# Patient Record
Sex: Female | Born: 1980 | Hispanic: No | Marital: Married | Smoking: Never smoker
Health system: Southern US, Community
[De-identification: ages and names within clinical notes are randomized; demographics above are authoritative.]

## PROBLEM LIST (undated history)

## (undated) ENCOUNTER — Inpatient Hospital Stay (HOSPITAL_COMMUNITY): Payer: Self-pay

## (undated) DIAGNOSIS — T4145XA Adverse effect of unspecified anesthetic, initial encounter: Secondary | ICD-10-CM

## (undated) DIAGNOSIS — T8859XA Other complications of anesthesia, initial encounter: Secondary | ICD-10-CM

## (undated) DIAGNOSIS — R0600 Dyspnea, unspecified: Secondary | ICD-10-CM

---

## 2017-01-21 ENCOUNTER — Emergency Department (HOSPITAL_COMMUNITY)
Admission: EM | Admit: 2017-01-21 | Discharge: 2017-01-21 | Disposition: A | Discharge status: Home / Self Care | Payer: Medicaid Other | Attending: Emergency Medicine | Admitting: Emergency Medicine

## 2017-01-21 ENCOUNTER — Encounter (HOSPITAL_COMMUNITY): Payer: Self-pay | Admitting: *Deleted

## 2017-01-21 ENCOUNTER — Emergency Department (HOSPITAL_COMMUNITY): Payer: Medicaid Other

## 2017-01-21 ENCOUNTER — Other Ambulatory Visit: Payer: Self-pay

## 2017-01-21 DIAGNOSIS — O200 Threatened abortion: Secondary | ICD-10-CM | POA: Diagnosis not present

## 2017-01-21 DIAGNOSIS — R1030 Lower abdominal pain, unspecified: Secondary | ICD-10-CM | POA: Diagnosis not present

## 2017-01-21 DIAGNOSIS — O209 Hemorrhage in early pregnancy, unspecified: Secondary | ICD-10-CM | POA: Diagnosis present

## 2017-01-21 LAB — ABO/RH: ABO/RH(D): O POS

## 2017-01-21 LAB — I-STAT CHEM 8, ED
BUN: 14 mg/dL (ref 6–20)
CALCIUM ION: 1.19 mmol/L (ref 1.15–1.40)
CHLORIDE: 104 mmol/L (ref 101–111)
CREATININE: 0.6 mg/dL (ref 0.44–1.00)
GLUCOSE: 100 mg/dL — AB (ref 65–99)
HCT: 41 % (ref 36.0–46.0)
Hemoglobin: 13.9 g/dL (ref 12.0–15.0)
POTASSIUM: 3.5 mmol/L (ref 3.5–5.1)
Sodium: 140 mmol/L (ref 135–145)
TCO2: 25 mmol/L (ref 22–32)

## 2017-01-21 LAB — I-STAT BETA HCG BLOOD, ED (MC, WL, AP ONLY): I-stat hCG, quantitative: 1060 m[IU]/mL — ABNORMAL HIGH (ref <5)

## 2017-01-21 NOTE — ED Provider Notes (Signed)
[facility] EMERGENCY DEPARTMENT Provider Note   CSN: [id number] Arrival date & time: 01/21/17  1306     History   Chief Complaint Chief Complaint  Patient presents with  . Vaginal Bleeding    HPI Teresa Marsh is a 36 y.o. female.  She presents for evaluation of vaginal bleeding onset yesterday, heavy associated with lower abdominal pain.  She is 2 weeks late for her menses.  She has taken home pregnancy test which was positive.  She has low back pain.  Today she felt hot but did not take her temperature.  She has not had any weakness, dizziness, nausea or vomiting.  There are no other known modifying factors.  HPI  History reviewed. No pertinent past medical history.  There are no active problems to display for this patient.   History reviewed. No pertinent surgical history.  OB History    Gravida Para Term Preterm AB Living   1             SAB TAB Ectopic Multiple Live Births                   Home Medications    Prior to Admission medications   Not on File    Family History History reviewed. No pertinent family history.  Social History Social History   Tobacco Use  . Smoking status: Not on file  Substance Use Topics  . Alcohol use: Not on file  . Drug use: Not on file     Allergies   Patient has no known allergies.   Review of Systems Review of Systems  All other systems reviewed and are negative.    Physical Exam Updated Vital Signs BP 119/72 (BP Location: Right Arm)   Pulse 84   Temp 98.1 F (36.7 C) (Oral)   Resp 18   LMP 12/06/2016   SpO2 99%   Physical Exam  Constitutional: She is oriented to person, place, and time. She appears well-developed and well-nourished.  HENT:  Head: Normocephalic and atraumatic.  Eyes: Conjunctivae and EOM are normal. Pupils are equal, round, and reactive to light.  Neck: Normal range of motion and phonation normal. Neck supple.  Cardiovascular: Normal rate and regular  rhythm.  Pulmonary/Chest: Effort normal and breath sounds normal. She exhibits no tenderness.  Abdominal: Soft. She exhibits no distension. There is no tenderness. There is no guarding.  Genitourinary:  Genitourinary Comments: Normal external female genitalia with a small amount of blood on the perineum.  Vagina appears normal other than blood in the vagina, without clots.  Cervix is long and closed, and appears multiparous, and not specifically dilated.  Musculoskeletal: Normal range of motion.  Neurological: She is alert and oriented to person, place, and time. She exhibits normal muscle tone.  Skin: Skin is warm and dry.  Psychiatric: She has a normal mood and affect. Her behavior is normal. Judgment and thought content normal.  Nursing note and vitals reviewed.    ED Treatments / Results  Labs (all labs ordered are listed, but only abnormal results are displayed) Labs Reviewed  I-STAT BETA HCG BLOOD, ED ([facility], [facility], [facility] ONLY) - Abnormal; Notable for the following components:      Result Value   I-stat hCG, quantitative 1,060.0 (*)    All other components within normal limits  I-STAT CHEM 8, ED - Abnormal; Notable for the following components:   Glucose, Bld 100 (*)    All other components within normal limits  ABO/RH    EKG  EKG Interpretation None       Radiology US Ob Comp < 14 Wks  Result Date: 01/21/2017 CLINICAL DATA:  Vaginal bleeding, beta HCG 1,060 EXAM: OBSTETRIC <14 WK [place] AND TRANSVAGINAL OB US TECHNIQUE: Both transabdominal and transvaginal ultrasound examinations were performed for complete evaluation of the gestation as well as the maternal uterus, adnexal regions, and pelvic cul-de-sac. Transvaginal technique was performed to assess early pregnancy. COMPARISON:  None. FINDINGS: Intrauterine gestational sac: None Yolk sac:  Not Visualized. Embryo:  Not Visualized. Cardiac Activity: Not applicable. Heart Rate: Not applicable. Subchorionic hemorrhage:  None visualized.  Maternal uterus/adnexae: No ectopic pregnancy is identified. The right ovary measured 1.7 x 2.2 x 2.6 cm and the left ovary measures 3.4 x 1.8 x 1.8 cm. The uterus is slightly anteverted in appearance. The endometrial stripe is homogeneous. There is no free fluid in the pelvis. IMPRESSION: No intrauterine or ectopic pregnancy is noted. Given history of vaginal bleeding, a spontaneous abortion might account for this appearance. Electronically Signed   By: Ashley Royalty M.D.   On: 01/21/2017 19:22   US Ob Transvaginal  Result Date: 01/21/2017 CLINICAL DATA:  Vaginal bleeding, beta HCG 1,060 EXAM: OBSTETRIC <14 WK [place] AND TRANSVAGINAL OB US TECHNIQUE: Both transabdominal and transvaginal ultrasound examinations were performed for complete evaluation of the gestation as well as the maternal uterus, adnexal regions, and pelvic cul-de-sac. Transvaginal technique was performed to assess early pregnancy. COMPARISON:  None. FINDINGS: Intrauterine gestational sac: None Yolk sac:  Not Visualized. Embryo:  Not Visualized. Cardiac Activity: Not applicable. Heart Rate: Not applicable. Subchorionic hemorrhage:  None visualized. Maternal uterus/adnexae: No ectopic pregnancy is identified. The right ovary measured 1.7 x 2.2 x 2.6 cm and the left ovary measures 3.4 x 1.8 x 1.8 cm. The uterus is slightly anteverted in appearance. The endometrial stripe is homogeneous. There is no free fluid in the pelvis. IMPRESSION: No intrauterine or ectopic pregnancy is noted. Given history of vaginal bleeding, a spontaneous abortion might account for this appearance. Electronically Signed   By: Ashley Royalty M.D.   On: 01/21/2017 19:22    Procedures Procedures (including critical care time)  Medications Ordered in ED Medications - No data to display   Initial Impression / Assessment and Plan / ED Course  I have reviewed the triage vital signs and the nursing notes.  Pertinent labs & imaging results that were available during my care  of the patient were reviewed by me and considered in my medical decision making (see chart for details).      Patient Vitals for the past 24 hrs:  BP Temp Temp src Pulse Resp SpO2  01/21/17 1916 119/72 - - 84 18 99 %  01/21/17 1402 117/81 98.1 F (36.7 C) Oral 90 18 100 %    8:09 PM Reevaluation with update and discussion. After initial assessment and treatment, an updated evaluation reveals she remains comfortable has no further complaints.  Findings discussed with patient and husband, using a [place] Ecologist, by telemetry.  Findings discussed and all questions answered. Daleen Bo      Final Clinical Impressions(s) / ED Diagnoses   Final diagnoses:  Threatened miscarriage    Vaginal bleeding in early pregnancy, consistent with threatened abortion.  Rh+, she does not require RhoGam.  She is hemodynamically stable.  There is no indication for further assessment or treatment at this time.  Nursing Notes Reviewed/ Care Coordinated Applicable Imaging Reviewed Interpretation of Laboratory Data incorporated into  ED treatment  The patient appears reasonably screened and/or stabilized for discharge and I doubt any other medical condition or other [facility] requiring further screening, evaluation, or treatment in the ED at this time prior to discharge.  Plan: Home Medications-OTC analgesia as needed; Home Treatments-rest, fluids; return here if the recommended treatment, does not improve the symptoms; Recommended follow up-repeat hCG in 2 days, at the maternity admission unit of [facility]  Follow-up sooner as needed, for problems.   ED Discharge Orders    None       Daleen Bo, MD 01/21/17 2011

## 2017-01-21 NOTE — ED Notes (Signed)
Patient verbalized understanding of discharge instructions and denies any further needs or questions at this time. VS stable. Patient ambulatory with steady gait.  

## 2017-01-21 NOTE — Discharge Instructions (Signed)
Since you are having bleeding it is important to rest and drink a lot of fluids, especially water.  If you feel dizzy when you stand up, sit down.  Go to the [facility], in 2 days for a checkup and repeat blood test.  If you are having other problems, you can go there any time to be evaluated and treated.

## 2017-01-21 NOTE — ED Triage Notes (Signed)
Pt reports being pregnant, had two + home preg test. Onset this am of vaginal bleeding. lmp 10/4.

## 2017-01-23 ENCOUNTER — Ambulatory Visit: Payer: Self-pay | Admitting: General Practice

## 2017-01-23 DIAGNOSIS — O3680X0 Pregnancy with inconclusive fetal viability, not applicable or unspecified: Secondary | ICD-10-CM

## 2017-01-23 LAB — HCG, QUANTITATIVE, PREGNANCY: hCG, Beta Chain, Quant, S: 170 m[IU]/mL — ABNORMAL HIGH (ref <5)

## 2017-01-23 NOTE — Progress Notes (Signed)
I have reviewed this chart and agree with the RN assessment and management.    Feliz Beam, M.D. Attending [place], [facility] for [employer], [place]

## 2017-01-23 NOTE — Progress Notes (Signed)
Patient here for stat bhcg today. [employer] interpreter [phone] used for initial encounter. Patient reports light bleeding and very small clots seen occasionally since ER visit. Patient denies pain. Discussed with patient having her wait in lobby for results & updated plan of care. Patient verbalized understanding & had no questions at this time. Reviewed patient's labs with Dr Rosana Hoes who states bhcg levels could represent SAB but patient should have repeat in 48 hours in MAU with ectopic precautions.  [employer] Interpreter [phone] used and informed patient of results and recommendation. Also discussed returning to MAU sooner if she develops severe pain, pain in shoulder, heavy bleeding or shortness of breath/difficulty breathing. Patient verbalized understanding to all & had no questions.

## 2017-01-25 ENCOUNTER — Encounter (HOSPITAL_COMMUNITY): Payer: Self-pay

## 2017-01-25 ENCOUNTER — Inpatient Hospital Stay (HOSPITAL_COMMUNITY)
Admission: AD | Admit: 2017-01-25 | Discharge: 2017-01-25 | Disposition: A | Discharge status: Home / Self Care | Payer: Medicaid Other | Source: Ambulatory Visit | Attending: Obstetrics and Gynecology | Admitting: Obstetrics and Gynecology

## 2017-01-25 DIAGNOSIS — O039 Complete or unspecified spontaneous abortion without complication: Secondary | ICD-10-CM | POA: Insufficient documentation

## 2017-01-25 LAB — HCG, QUANTITATIVE, PREGNANCY: HCG, BETA CHAIN, QUANT, S: 69 m[IU]/mL — AB (ref <5)

## 2017-01-25 NOTE — MAU Note (Signed)
Patient presents for repeat HCG, stating only having spotting, denies pain.

## 2017-01-25 NOTE — MAU Provider Note (Signed)
S: 36 y.o. G1P0 @[redacted]w[redacted]d  by LMP presents to MAU for repeat hcg.  She denies abdominal pain or vaginal bleeding today.    Her quant hcg on 11/19 was 1060 and ultrasound showed no IUP or ectopic.  Repeat quant hcg on 11/21 in [facility] office was 170. Pt was sent to MAU for a quant in 48 hours.  HPI  O: BP 112/61 (BP Location: Right Arm)   Pulse 87   Temp 98.2 F (36.8 C)   Resp 17   LMP 12/06/2016   VS reviewed, nursing note reviewed,  Constitutional: well developed, well nourished, no distress HEENT: normocephalic CV: normal rate Pulm/chest wall: normal effort Abdomen: soft Neuro: alert and oriented x 3 Skin: warm, dry Psych: affect normal  Results for orders placed or performed during the hospital encounter of 01/25/17 (from the past 24 hour(s))  hCG, quantitative, pregnancy     Status: Abnormal   Collection Time: 01/25/17 10:34 AM  Result Value Ref Range   hCG, Beta Chain, Quant, S 69 (H) <5 mIU/mL    --/--/O POS (11/19 1806)  MDM: Ordered labs/reviewed results.  Quant dropped significantly.  Discussed results with pt as definitive for miscarriage.  [place] interpreter on language line used.  Pt stated understanding and was given opportunity to ask questions.  Concerned about costs today but her pregnancy Medicaid is pending so assured her this should cover all her visits and would last for at least another 30 days despite the resolution of the pregnancy. Pt to f/u in office in 1 week for hcg and 2 weeks for visit with provider.  Return to MAU as needed for emergencies.  Pt stable at time of discharge.  A:  1. SAB (spontaneous abortion)     P: D/C home with bleeding precautions F/U with hcg in 1 week, provider visit in 2 weeks at [facility] Return to MAU as needed for emergencies  LEFTWICH-KIRBY, Jamespaul Secrist, CNM 2:18 PM

## 2017-02-01 ENCOUNTER — Other Ambulatory Visit: Payer: Self-pay

## 2017-02-01 DIAGNOSIS — O039 Complete or unspecified spontaneous abortion without complication: Secondary | ICD-10-CM

## 2017-02-02 LAB — BETA HCG QUANT (REF LAB): hCG Quant: 5 m[IU]/mL

## 2017-02-06 ENCOUNTER — Telehealth: Payer: Self-pay | Admitting: General Practice

## 2017-02-06 NOTE — Telephone Encounter (Signed)
-----   Message from Elvera Maria, CNM sent at 02/03/2017  9:09 PM EST ----- This pt had follow up hcg of 5, dropped from 170 last week and >1000 initially.  I spoke with her in MAU about how this is a miscarriage so she is aware. She speaks [place] and needs the language line and I am working night shift x 2 days. Could someone in the office call her to tell her the result? And she has an appt scheduled on 12/7 in the office. She can choose to cancel this appointment or to keep it, it is her choice. She does not need any more follow up for the miscarriage if she feels well.

## 2017-02-06 NOTE — Telephone Encounter (Signed)
Called patient with interpreter [phone], no answer- left message stating we are trying to reach you with results, please call us back

## 2017-02-08 ENCOUNTER — Telehealth: Payer: Self-pay | Admitting: Advanced Practice Midwife

## 2017-02-08 ENCOUNTER — Encounter: Payer: Self-pay | Admitting: Advanced Practice Midwife

## 2017-02-08 NOTE — Telephone Encounter (Signed)
Pt presented to office today because she had a miscarriage follow up visit scheduled. She missed the phone call presenting her hcg hormone level on 02/01/17 of 5, down from 170 on 11/23 and 1060 on 11/19.  Since the pt is not having any symptoms and now is aware her hcg level is normal, she does not desire an office visit today.  Visit cancelled and pt to f/u as desired.

## 2017-03-05 NOTE — L&D Delivery Note (Addendum)
Patient is 37 y.o. [id number] [redacted]w[redacted]d admitted for SOL. S/p IOL with Pitocin. SROM at 1350.  Prenatal course also complicated by uterine size date discrepancy  (normal EFW, uterine fibroids may have been contributing), advanced maternal age, breech presentation s/p successful version 9/13 .  Delivery Note At 9:02 PM a viable female was delivered via Vaginal, Spontaneous (Presentation: ROA ).  APGAR: 9, 9; weight  .   Placenta status: intact, spontaneous .  Cord:  3 vesselwith the following complications: none.   Head delivered ROA. No nuchal cord present. Shoulder and body delivered in usual fashion. Infant with spontaneous cry, placed on mother's abdomen, dried and bulb suctioned. Cord clamped x 2 after 1-minute delay, and cut by Dr. Juleen China. Cord blood drawn. Placenta delivered spontaneously with gentle cord traction. Fundus firm with massage and Pitocin. Perineum inspected and found to have 2nd degree perineal laceration, which was found to be hemostatic. Which was repaired with good hemostasis achieved.   Anesthesia:  Epidural  Episiotomy: None Lacerations: 2nd degree;Perineal Suture Repair: 3.0 monochryl Est. Blood Loss (mL): 366  Mom to postpartum.  Baby to Couplet care / Skin to Skin.  Caroline More, DO PGY-2 12/07/2017, 9:31 PM  OB FELLOW DELIVERY ATTESTATION  I was gloved and present for the delivery in its entirety, and I agree with the above resident's note.    Phill Myron, D.O. OB Fellow  12/08/2017, 9:19 AM

## 2017-04-23 ENCOUNTER — Encounter: Payer: Self-pay | Admitting: General Practice

## 2017-04-23 ENCOUNTER — Encounter: Payer: Self-pay | Admitting: Family Medicine

## 2017-04-23 ENCOUNTER — Ambulatory Visit (INDEPENDENT_AMBULATORY_CARE_PROVIDER_SITE_OTHER): Payer: Medicaid Other | Admitting: General Practice

## 2017-04-23 ENCOUNTER — Encounter: Payer: Self-pay | Admitting: Obstetrics & Gynecology

## 2017-04-23 DIAGNOSIS — Z3201 Encounter for pregnancy test, result positive: Secondary | ICD-10-CM | POA: Diagnosis not present

## 2017-04-23 LAB — POCT PREGNANCY, URINE: Preg Test, Ur: POSITIVE — AB

## 2017-04-23 NOTE — Progress Notes (Signed)
[place] [phone] used for today's encounter. Patient here for UPT today. UPT +. Patient reports first positive home test 04/03/17. LMP 02/24/18 EDD 12/01/17 [redacted]w[redacted]d today. Patient informed of dating. Patient denies taking any meds only PNV. Recommended she begin OB care. Patient verbalized understanding & had no questions

## 2017-04-23 NOTE — Progress Notes (Signed)
Chart reviewed for nurse visit. Agree with plan of care.   Katheren Shams, DO 04/23/2017 9:20 AM

## 2017-05-07 ENCOUNTER — Encounter: Payer: Self-pay | Admitting: Student

## 2017-05-08 ENCOUNTER — Ambulatory Visit (INDEPENDENT_AMBULATORY_CARE_PROVIDER_SITE_OTHER): Payer: Medicaid Other | Admitting: Advanced Practice Midwife

## 2017-05-08 ENCOUNTER — Other Ambulatory Visit (HOSPITAL_COMMUNITY)
Admission: RE | Admit: 2017-05-08 | Discharge: 2017-05-08 | Disposition: A | Discharge status: Home / Self Care | Payer: Medicaid Other | Source: Ambulatory Visit | Attending: Student | Admitting: Student

## 2017-05-08 ENCOUNTER — Encounter: Payer: Self-pay | Admitting: Advanced Practice Midwife

## 2017-05-08 ENCOUNTER — Ambulatory Visit: Payer: Self-pay

## 2017-05-08 ENCOUNTER — Encounter: Payer: PRIVATE HEALTH INSURANCE | Admitting: Student

## 2017-05-08 DIAGNOSIS — Z3A10 10 weeks gestation of pregnancy: Secondary | ICD-10-CM | POA: Insufficient documentation

## 2017-05-08 DIAGNOSIS — Z3480 Encounter for supervision of other normal pregnancy, unspecified trimester: Secondary | ICD-10-CM

## 2017-05-08 DIAGNOSIS — O26843 Uterine size-date discrepancy, third trimester: Secondary | ICD-10-CM | POA: Insufficient documentation

## 2017-05-08 DIAGNOSIS — O09529 Supervision of elderly multigravida, unspecified trimester: Secondary | ICD-10-CM | POA: Insufficient documentation

## 2017-05-08 DIAGNOSIS — O26841 Uterine size-date discrepancy, first trimester: Secondary | ICD-10-CM | POA: Insufficient documentation

## 2017-05-08 DIAGNOSIS — O09521 Supervision of elderly multigravida, first trimester: Secondary | ICD-10-CM

## 2017-05-08 LAB — POCT URINALYSIS DIP (DEVICE)
BILIRUBIN URINE: NEGATIVE
Bilirubin Urine: NEGATIVE
GLUCOSE, UA: NEGATIVE mg/dL
Glucose, UA: NEGATIVE mg/dL
HGB URINE DIPSTICK: NEGATIVE
Hgb urine dipstick: NEGATIVE
Ketones, ur: NEGATIVE mg/dL
Ketones, ur: NEGATIVE mg/dL
LEUKOCYTES UA: NEGATIVE
Leukocytes, UA: NEGATIVE
NITRITE: NEGATIVE
NITRITE: NEGATIVE
PH: 7 (ref 5.0–8.0)
Protein, ur: NEGATIVE mg/dL
Protein, ur: NEGATIVE mg/dL
Specific Gravity, Urine: 1.02 (ref 1.005–1.030)
Specific Gravity, Urine: 1.02 (ref 1.005–1.030)
UROBILINOGEN UA: 0.2 mg/dL (ref 0.0–1.0)
UROBILINOGEN UA: 1 mg/dL (ref 0.0–1.0)
pH: 7 (ref 5.0–8.0)

## 2017-05-08 MED ORDER — CONCEPT OB 130-92.4-1 MG PO CAPS: 1.0000 | ORAL_CAPSULE | Freq: Every day | ORAL | 12 refills | Status: AC

## 2017-05-08 NOTE — Progress Notes (Signed)
Pt informed that the ultrasound is considered a limited OB ultrasound and is not intended to be a complete ultrasound exam.  Patient also informed that the ultrasound is not being completed with the intent of assessing for fetal or placental anomalies or any pelvic abnormalities.  Explained that the purpose of today's ultrasound is to assess for  viability & dating.  Patient acknowledges the purpose of the exam and the limitations of the study.

## 2017-05-08 NOTE — Progress Notes (Signed)
  Subjective:    [facility] is being seen today for her first obstetrical visit.  This is a planned pregnancy. She is at [redacted]w[redacted]d gestation. Her obstetrical history is significant for advanced maternal age. Relationship with FOB: spouse, living together. Patient does intend to breast feed. Pregnancy history fully reviewed.  Patient reports nausea.  Review of Systems:   Review of Systems  Constitutional: Negative for chills and fever.  Gastrointestinal: Positive for nausea. Negative for abdominal pain and vomiting.  Genitourinary: Negative for vaginal bleeding and vaginal discharge.    Objective:     BP 126/76   Pulse 82   Ht 4\' 10"  (1.473 m)   Wt 134 lb (60.8 kg)   LMP 02/24/2017 (Exact Date)   BMI 28.01 kg/m  Physical Exam  Constitutional: She is oriented to person, place, and time. She appears well-developed and well-nourished.  Neck: No thyromegaly present.  Cardiovascular: Normal rate, regular rhythm and normal heart sounds.  Respiratory: Effort normal and breath sounds normal. No respiratory distress.  GI: Soft. There is no tenderness.  Fundus 2/SP?  Genitourinary: Vagina normal. No breast swelling, tenderness, discharge or bleeding. There is no lesion on the right labia. There is no lesion on the left labia. Uterus is enlarged (12 weeks-size). Uterus is not tender. Cervix exhibits no motion tenderness, no discharge and no friability. Right adnexum displays no mass, no tenderness and no fullness. Left adnexum displays no mass, no tenderness and no fullness. No erythema or bleeding in the vagina. No vaginal discharge found.  Musculoskeletal: She exhibits no edema or tenderness.  Neurological: She is alert and oriented to person, place, and time. She has normal reflexes.  Skin: Skin is warm and dry.  Psychiatric: She has a normal mood and affect.    Maternal Exam:  Abdomen: Fundal height is 12.    Introitus: Normal vulva. Normal vagina.  Vagina is negative for discharge.   Pelvis: adequate for delivery.   Cervix: Cervix evaluated by sterile speculum exam and digital exam.     Fetal Exam Fetal Monitor Review: Mode: hand-held doppler probe.   Baseline rate: 165.         Assessment:    Pregnancy: [id number] Patient Active Problem List   Diagnosis Date Noted  . Supervision of other normal pregnancy, antepartum 05/08/2017  . AMA (advanced maternal age) multigravida 35+ 05/08/2017       Plan:  1. Supervision of other normal pregnancy, antepartum  - Culture, OB Urine - Cystic fibrosis gene test - Cytology - PAP - Hemoglobinopathy Evaluation - Obstetric Panel, Including HIV - SMN1 COPY NUMBER ANALYSIS (SMA Carrier Screen) - Prenat w/o A Vit-FeFum-FePo-FA (CONCEPT OB) 130-92.4-1 MG CAPS; Take 1 tablet by mouth daily.  Dispense: 30 capsule; Refill: 12  2. Elderly multigravida in first trimester  - Culture, OB Urine - Cystic fibrosis gene test - Cytology - PAP - Hemoglobinopathy Evaluation - Obstetric Panel, Including HIV - SMN1 COPY NUMBER ANALYSIS (SMA Carrier Screen)  3. Uterine size date discrepancy pregnancy, first trimester  - [place] S=D   Initial labs drawn. Prenatal vitamins. Problem list reviewed and updated. NIPS discussed: undecided. Role of ultrasound in pregnancy discussed; fetal survey: requested. Amniocentesis discussed: not indicated. Follow up in 4 weeks.   Manya Silvas 05/08/2017

## 2017-05-08 NOTE — Patient Instructions (Signed)

## 2017-05-08 NOTE — Progress Notes (Signed)
[place]

## 2017-05-09 ENCOUNTER — Encounter: Payer: Self-pay | Admitting: *Deleted

## 2017-05-10 LAB — CULTURE, OB URINE

## 2017-05-10 LAB — URINE CULTURE, OB REFLEX

## 2017-05-11 LAB — CYTOLOGY - PAP
CHLAMYDIA, DNA PROBE: NEGATIVE
DIAGNOSIS: NEGATIVE
HPV: NOT DETECTED
NEISSERIA GONORRHEA: NEGATIVE

## 2017-05-14 LAB — OBSTETRIC PANEL, INCLUDING HIV
Antibody Screen: NEGATIVE
Basophils Absolute: 0 10*3/uL (ref 0.0–0.2)
Basos: 0 %
EOS (ABSOLUTE): 0.2 10*3/uL (ref 0.0–0.4)
Eos: 1 %
HEP B S AG: NEGATIVE
HIV SCREEN 4TH GENERATION: NONREACTIVE
Hematocrit: 41.3 % (ref 34.0–46.6)
Hemoglobin: 13.8 g/dL (ref 11.1–15.9)
IMMATURE GRANS (ABS): 0 10*3/uL (ref 0.0–0.1)
IMMATURE GRANULOCYTES: 0 %
LYMPHS: 22 %
Lymphocytes Absolute: 2.4 10*3/uL (ref 0.7–3.1)
MCH: 31.9 pg (ref 26.6–33.0)
MCHC: 33.4 g/dL (ref 31.5–35.7)
MCV: 95 fL (ref 79–97)
MONOCYTES: 5 %
Monocytes Absolute: 0.6 10*3/uL (ref 0.1–0.9)
NEUTROS ABS: 7.5 10*3/uL — AB (ref 1.4–7.0)
Neutrophils: 72 %
Platelets: 315 10*3/uL (ref 150–379)
RBC: 4.33 x10E6/uL (ref 3.77–5.28)
RDW: 13.6 % (ref 12.3–15.4)
RPR: NONREACTIVE
RUBELLA: 21.3 {index} (ref >0.99)
Rh Factor: POSITIVE
WBC: 10.6 10*3/uL (ref 3.4–10.8)

## 2017-05-14 LAB — HEMOGLOBINOPATHY EVALUATION
FERRITIN: 119 ng/mL (ref 15–150)
HGB A2 QUANT: 2.2 % (ref 1.8–3.2)
HGB F QUANT: 0.9 % (ref 0.0–2.0)
Hgb A: 96.9 % (ref 96.4–98.8)
Hgb C: 0 %
Hgb S: 0 %
Hgb Solubility: NEGATIVE
Hgb Variant: 0 %

## 2017-05-14 LAB — CYSTIC FIBROSIS GENE TEST

## 2017-05-17 LAB — SMN1 COPY NUMBER ANALYSIS (SMA CARRIER SCREENING)

## 2017-06-05 ENCOUNTER — Ambulatory Visit (INDEPENDENT_AMBULATORY_CARE_PROVIDER_SITE_OTHER): Payer: Medicaid Other | Admitting: Advanced Practice Midwife

## 2017-06-05 ENCOUNTER — Encounter: Payer: Self-pay | Admitting: Advanced Practice Midwife

## 2017-06-05 VITALS — BP 115/76 | HR 89 | Wt 134.9 lb

## 2017-06-05 DIAGNOSIS — Z363 Encounter for antenatal screening for malformations: Secondary | ICD-10-CM

## 2017-06-05 DIAGNOSIS — Z3480 Encounter for supervision of other normal pregnancy, unspecified trimester: Secondary | ICD-10-CM

## 2017-06-05 DIAGNOSIS — O09522 Supervision of elderly multigravida, second trimester: Secondary | ICD-10-CM

## 2017-06-05 DIAGNOSIS — Z3A18 18 weeks gestation of pregnancy: Secondary | ICD-10-CM

## 2017-06-05 NOTE — Progress Notes (Signed)
   PRENATAL VISIT NOTE  Subjective:  Teresa Marsh is a 37 y.o. [phone] at [redacted]w[redacted]d being seen today for ongoing prenatal care.  She is currently monitored for the following issues for this high-risk pregnancy and has Supervision of other normal pregnancy, antepartum; AMA (advanced maternal age) multigravida 35+; and Uterine size date discrepancy pregnancy, first trimester on their problem list.  Patient reports backache.  Contractions: Not present. Vag. Bleeding: None.   . Denies leaking of fluid.   The following portions of the patient's history were reviewed and updated as appropriate: allergies, current medications, past family history, past medical history, past social history, past surgical history and problem list. Problem list updated.  Objective:   Vitals:   06/05/17 0824  BP: 115/76  Pulse: 89  Weight: 134 lb 14.4 oz (61.2 kg)    Fetal Status: Fetal Heart Rate (bpm): 156         General:  Alert, oriented and cooperative. Patient is in no acute distress.  Skin: Skin is warm and dry. No rash noted.   Cardiovascular: Normal heart rate noted  Respiratory: Normal respiratory effort, no problems with respiration noted  Abdomen: Soft, gravid, appropriate for gestational age.  Pain/Pressure: Present     Pelvic: Cervical exam deferred        Extremities: Normal range of motion.  Edema: None  Mental Status: Normal mood and affect. Normal behavior. Normal judgment and thought content.   Assessment and Plan:  Pregnancy: [id number] at [redacted]w[redacted]d  1. [redacted] weeks gestation of pregnancy  - [place] MFM OB DETAIL +14 WK; Future  2. AMA (advanced maternal age) multigravida 69+, second trimester - refused Genetic screeing - [place] MFM OB DETAIL +14 WK; Future  3. Encounter for antenatal screening for malformations  - [place] MFM OB DETAIL +14 WK; Future  4. Supervision of other normal pregnancy, antepartum   5. Multigravida of advanced maternal age in second trimester   Preterm labor symptoms and  general obstetric precautions including but not limited to vaginal bleeding, contractions, leaking of fluid and fetal movement were reviewed in detail with the patient. Please refer to After Visit Summary for other counseling recommendations.  Return in about 1 month (around 07/03/2017) for [place].  Future Appointments  Date Time Provider [place]  07/04/2017  8:00 AM [place] 3 WH-MFCUS MFC-US  07/04/2017  9:55 AM Jorje Guild, NP [place], [place]

## 2017-06-05 NOTE — Patient Instructions (Addendum)
AREA PEDIATRIC/FAMILY [facility] [address], [address] [place], [place]  [address] Phone - [phone]   Fax - [place], [place]  [address] Phone - [phone]   Fax - [phone]    Manya Silvas th? hai c?a [place] k? Second Trimester of Pregnancy Ba thng th? hai l t? tu?n 14 ??n tu?n 27 (thng th? 4 ??n th? thng th? 6). Ba thng th? hai th??ng l th?i gian m qu v? c?m th?y tho?i mi nh?t. C? th? c?a qu v? c?ng ? ?i?u ch?nh ?? ph h?p v?i vi?c mang thai v qu v? b?t ??u c?m th?y t?t h?n v? m?t th? ch?t. Thng th??ng, tnh tr?ng nghn ? t h?n ho?c h?t hon ton, qu v? c th? c nhi?u n?ng l??ng h?n v qu v? c th? ?n ngon mi?ng h?n. Ba thng th? hai c?ng l th?i gian m bo [place] ?ang pht tri?n nhanh chng. Vo cu?i thng th? su, bo thai di kho?ng 9 in s? v n?ng kho?ng 1 pao. Qu v? c kh? n?ng s? b?t ??u c?m th?y em b c? ??ng (thai ??p l?n ??u) trong kho?n tu?n 16 ??n 20 c?a [place] k?. Cc thay ??i c? th? trong ba thng th? hai c?a [place] k? C? th? qu v? b?t ??u tr?i qua nhi?u thay ??i trong ba thng th? hai c?a [place] k?. Nh?ng thay ??i khc nhau ? cc ph? n? s? khc nhau.  Cn n?ng c?a qu v? s? ti?p t?c t?ng. Qu v? s? nh?n th?y b?ng ph?ng to ra.  Qu v? c th? b?t ??u c cc v?t r?n trn hng, b?ng v v.  Qu v? c th? b? ?au ??u, c th? gi?m ?au b?ng thu?c. Cc lo?i thu?c ph?i ???c chuyn gia ch?m [place] s?c kh?e c?a qu v? ch?p thu?n.  Qu v? c th? ?i ti?u th??ng xuyn h?n v bo thai p vo bng quang c?a qu v?.  Qu v? c th? b? ho?c ti?p t?c b? ? nng do k?t qu? c?a vi?c mang [place].  Qu v? c th? b? to bn v m?t s? hc-mn nh?t ??nh ?ang lm cc c? ??y ch?t th?i qua ???ng ru?t ho?t ??ng ch?m l?i.  Qu v? c th? b? tr? ho?c s?ng, phnh t?nh m?ch (gin t?nh m?ch).  Quy? vi? c th? bi? ?au l?ng. V?n ?? ny l do: ? T?ng cn. ? Cc hc-mn thai k? lm th? gin  cc kh?p ? khung ch?u c?a qu v?. ? Thay ??i v? cn n?ng v cc c? h? tr? th?ng b?ng c?a qu v?.  V c?a qu v? s? ti?p t?c pht tri?n v ti?p t?c tr? nn nh?y c?m ?au.  N??u (l?i) c?a qu v? c th? ch?y mu v c th? nh?y c?m v?i vi?c ?nh r?ng v dng ch? nha khoa.  Cc ??m ho?c v?t t?i mu (rm m, m?t n? [place] k?) c th? pht tri?n trn m?t qu v?. V?t ny c th? s? m? sau khi em b ???c sinh ra.  M?t ???ng s?m mu ch?y t? r?n ??n vng lng mu (???ng linea nigra) c th? xu?t hi?n. V?t ny c th? s? m? sau khi em b ???c sinh ra.  Qu v? c nh?ng thay ??i v? tc. Nh?ng thay ??i ny c th? bao g?m tc dy ln, tc m?c nhanh h?n v thay ??i v? k?t c?u tc. M?t s? ph? n?  c?ng b? r?ng tc trong khi ho?c sau khi mang thai, ho?c c?m th?y tc kh ho?c m?ng. Tc c?a qu v? s? c nhi?u kh? n?ng s? tr? l?i bnh th??ng sau khi em b ???c sinh ra.  Nh?ng g d? ki?n s? x?y ra ? cc l?n khm tr??c khi sinh [place] m?t l?n khm tr??c khi sinh th??ng quy:  Qu v? s? ???c cn ?? ??m b?o qu v? v [place] nhi ?ang pht tri?n bnh th??ng.  Qu v? s? ???c ?o huy?t p.  Qu v? s? ???c ?o vng b?ng ?? theo di s? pht tri?n c?a b.  Nh?p tim thai s? ???c nghe.  B?t k? k?t qu? ki?m tra no t? l?n khm tr??c ? c?ng s? ???c th?o lu?n.  Chuyn gia ch?m [place] s?c kh?e c th? h?i qu v?:  Qu v? c?m th?y th? no.  Qu v? c c?m th?y em b c? ??ng hay khng.  Qu v? c b?t k? tri?u ch?ng b?t th??ng no, ch?ng h?n nh? ch?y d?ch, ch?y mu, ?au ??u r?t nhi?u ho?c co th?t ? b?ng hay khng.  Qu v? c ?ang s? d?ng b?t k? s?n ph?m thu?c l no, bao g?m thu?c l d?ng ht, thu?c l d?ng nhai v thu?c l ?i?n t? hay khng.  Qu v? c b?t k? cu h?i no hay khng.  Nh?ng ki?m tra khc c th? ???c th?c hi?n trong ba thng th? hai c?a qu v? bao g?m:  Xt nghi?m mu ?? ki?m tra: ? L??ng s?t th?p (thi?u mu). ? ???ng huy?t cao ?nh h??ng ??n ph? n? mang thai (ti?u ???ng [place] k?) t? tu?n 24 ??n tu?n 28. ? Khng th? RH.  Xt nghi?m ny ?? ki?m tra m?t protein trn h?ng c?u (y?u t? Rh).  Xt nghi?m n??c ti?u ?? ki?m tra nhi?m trng, ti?u ???ng ho?c protein trong n??c ti?u.  Siu m ?? xc ??nh s? t?ng tr??ng v pht tri?n ph h?p c?a em b.  Ch?c ?i ?? ki?m tra cc v?n ?? v? di truy?n c th? c.  Ki?m tra bo [place] xem c b? n?t ??t s?ng ho?c h?i ch?ng [place] hay khng.  Xt nghi?m HIV (vi rt gy suy gi?m mi?n d?ch ? ng??i). Xt nghi?m tr??c khi sinh th??ng quy bao g?m sng l?c HIV, tr? khi qu v? ch?n khng lm xt nghi?m ny.  Tun th? nh?ng h??ng d?n ny ? nh: Thu?c  Tun th? cc ch? d?n c?a chuyn gia ch?m [place] s?c kh?e v? vi?c s? d?ng thu?c. M?t s? lo?i thu?c c? th? co? th? an ton ho?c khng an ton [place] dng trong qu trnh [place].  U?ng vitamin tr??c khi sinh c t nh?t 600 microgram (mcg) axit folic.  N?u quy? vi? bi? ta?o bo?n, hy th? dng thu?c lm m?m phn n?u chuyn gia ch?m [place] s?c kh?e c?a qu v? ch?p thu?n. ?n v u?ng  ?n ch? ?? ?n cn b?ng bao g?m tri cy t??i v rau c?, ng? c?c nguyn cm, cc ngu?n protein t?t nh? th?t, tr?ng, ??u h? v s?a t bo. Chuyn gia ch?m [place] s?c kh?e c?a quy? vi? s? gip quy? vi? xc ??nh m??c t?ng cn phu? h??p cho quy? vi?.  Hessie Diener th?t s?ng v pho mt ch?a n?u chn. Nh?ng th? ny mang m?m b?nh c th? gy d? t?t b?m sinh cho em b.  N?u qu v? s? d?ng l??ng canxi th?p t? th?c ph?m, hy trao ??i v?i chuyn gia ch?m [place]  s?c kh?e v? vi?c qu v? c c?n dng th?c ph?m b? sung canxi hng ngy hay khng.  H?n ch? cc lo?i th?c ?n giu ch?t bo v ???ng tinh luy?n, ch?ng h?n nh? ?? ?n chin rn v ?? ng?t.  ?? ng?n ng?a to bn: ? U?ng ?? n??c ?? gi? cho n??c ti?u trong ho?c c mu vng nh?t. ? ?n th?c ?n giu ch?t x? nh? tri cy t??i v rau, ng? c?c nguyn h?t v cc lo?i ??u. Ho?t ??ng  Ch? t?p th? d?c theo ch? d?n c?a chuyn gia ch?m [place] s?c kh?e. H?u h?t ph? n? c th? ti?p t?c ch? ?? t?p luy?n bnh th??ng c?a h? trong khi mang [place]. C? g??ng t?p  th? du?c 30 phu?t m?i nga?y, t nh?t 5 nga?y m?i tu?n. Ng?ng t?p th? d?c n?u qu v? c cc c?n co t? cung.  Trnh nng v?t n?ng, hy ?i giy th?p gt v luy?n t?p t? th? ph h?p.  C th? ???c ti?p t?c c quan h? tnh d?c tr? khi chuyn gia ch?m [place] s?c kh?e yu c?u khc. Gi?m ?au v gi?m c?m gic kh ch?u  M?c m?t chi?c o ng?c nng ?? v hi?u qu? ?? ng?n ng?a c?m gic kh ch?u do v b? nh?y c?m ?au.  T?m b?n ng?i n??c ?m ?? lm d?u ?au ho?c d?u c?m gic kh ch?u do b?nh tr? gy ra. S? d?ng kem ?i?u tr? tr? n?u chuyn gia ch?m [place] s?c kh?e ch?p thu?n.  Ngh? ng?i trong khi nng cao chn c?a qu v? n?u qu v? b? chu?t rt chn ho?c ?au vng th?t l?ng.  N?u qu v? b? gin t?nh m?ch, hy ?eo t?t h? tr?. Nng cao chn c?a qu v? trong 15 pht, 3-4 l?n m?t ngy. H?n ch? mu?i trong ch? ?? ?n c?a qu v?. Ch?m [place] tr???c khi sinh  Vi?t ra cu h?i c?a quy? vi?. ?em ca?c cu ho?i ??n ca?c bu?i kha?m tr???c khi sinh.  Tun th? t?t c? cc cu?c h?n khm tr??c khi sinh theo ch? d?n c?a chuyn gia ch?m [place] s?c kh?e. ?i?u ny c vai tr quan tr?ng. An ton  Lun ?eo dy th?t an ton c?a qu v? khi li xe.  L?p m?t danh sch cc s? ?i?n tho?i c?p c?u, bao g?m s? ?i?n thoa?i gia ?nh, b?n b, b?nh vi?n v c?nh st v s? c?u h?a. H??ng d?n chung  H?i chuyn gia ch?m [place] s?c kh?e c?a quy? vi? ?? ???c gi?i thi?u ??n m?t l?p h?c tr???c khi sinh t?i ??a ph??ng. B?t ??u ca?c l?p h?c khng mu?n h?n vo ??u thng th?? 6 c?a [place] k?.  Yu c?u gip ?? n?u quy? vi? c nhu c?u t? v?n ho?c nhu c?u dinh d??ng trong th??i gian mang thai. Chuyn gia ch?m  s?c kh?e c?a quy? vi? c th? co? l?i khuyn ho?c gi?i thi?u quy? vi? ??n cc chuyn gia ?? ???c gip ?? v? ca?c nhu c?u khc nhau.  Khng s? d?ng b?n t?m n??c nng, phng xng h?i, ho?c nh t?m h?i.  Khng thu?t r??a ho?c s? d?ng nu?t b?ng v? sinh ho?c b?ng v? sinh c mi th?m.  Khng b??t cho chn trong th?i gian di.  Trnh h?p v? sinh c?a mo v ??t  v? sinh dnh cho mo. Nh?ng th?? na?y mang vi trng c th? gy ra d? t?t b?m sinh ? tr? v c th? m?t [place] nhi do s?y [place] ho?c thai ch?t l?u.  Trnh t?t c? khi, th?o d??c, r??u v ma ty khng ???c k ??n. Cc ch?t ha h?c trong nh??ng s?n ph?m ny c th? ?nh h??ng ??n s? hnh thnh v pht tri?n c?a em b.  Khng s? d?ng b?t k? s?n ph?m no ch?a nicotine ho?c thu?c l, ch?ng ha?n nh? thu?c l d?ng ht v thu?c l ?i?n t?. N?u qu v? c?n gip ?? ?? cai thu?c, hy h?i chuyn gia ch?m [place] s?c kh?e.  ??n khm nha s? n?u qu v? ch?a khm trong th?i gian qu v? mang [place]. S? d?ng m?t bn ch?i m?m ?? ?nh r?ng c?a qu v? v hy nh? nhng khi dng ch? nha khoa. Hy lin l?c v?i chuyn gia ch?m [place] s?c kh?e n?u:  Qu v? b? chng m?t.  Qu v? b? co th?t nh? ? vng ch?u, t?c n?ng ? vng ch?u, ?au m ? ? vng b?ng.  Qu v? b? bu?n nn, nn m?a ho?c tiu ch?y lin t?c.  Qu v? c d?ch m ??o mi kh ch?u.  Qu v? b? ?au khi ?i ti?u. Yu c?u tr? gip ngay l?p t?c n?u:  Qu v? b? s?t.  Qu v? b? r r? d?ch ? m ??o.  Qu v? c ra ??m mu ho?c ch?y mu ? m ??o.  Qu v? b? co th?t ho?c ?au r?t nhi?u ? b?ng.  Qu v? t?ng cn ho?c s?t cn nhanh.  Qu v? b? kh th? cng v?i ?au ng?c.  Qu v? th?y s?ng ??t ng?t ho?c r?t nhi?u ? m?t, bn tay, m?t c chn, bn chn ho?c chn.  Qu v? khng c?m th?y em b c? ??ng trong h?n m?t gi?Sander Nephew v? b? ?au ??u r?t nhi?u m khng h?t sau khi dng thu?c.  Qu v? b? thay ??i th? l?c. Tm t?t  Ba thng th? hai l t? tu?n 14 ??n tu?n 27 (thng th? 4 ??n th? thng th? 6). ? c?ng l th?i gian m bo [place] ?ang pht tri?n nhanh chng.  C? th? c?a qu v? tr?i qua r?t nhi?u thay ??i trong th?i gian mang [place]. Nh?ng thay ??i khc nhau ? cc ph? n? s? khc nhau.  Trnh t?t c? khi, th?o d??c, r??u v ma ty khng ???c k ??n. Cc ch?t ha h?c ny ?nh h??ng ??n s? hnh thnh v pht tri?n c?a em b.  Khng s? d?ng b?t k? s?n ph?m thu?c l no, ch?ng ha?n thu?c  l d?ng ht, thu?c l d?ng nhai v thu?c l ?i?n t?. N?u qu v? c?n gip ?? ?? cai thu?c, hy h?i chuyn gia ch?m [place] s?c kh?e.  Hy lin l?c v?i chuyn gia ch?m [place] s?c kh?e n?u qu v? c b?t k? th?c m?c no. Tun th? t?t c? cc cu?c h?n khm tr??c khi sinh theo ch? d?n c?a chuyn gia ch?m [place] s?c kh?e. ?i?u ny c vai tr quan tr?ng. Thng tin ny khng nh?m m?c ?ch thay th? cho l?i khuyn m chuyn gia ch?m [place] s?c kh?e ni v?i qu v?. Hy b?o ??m qu v? ph?i th?o lu?n b?t k? v?n ?? g m qu v? c v?i chuyn gia ch?m [place] s?c kh?e c?a qu v?. Document Released: 03/18/2015 Document Revised: 07/02/2016 Document Reviewed: 07/02/2016 Elsevier Interactive Patient Education  2018 [employer].

## 2017-06-05 NOTE — Progress Notes (Signed)
[place] interpreter Emogene Morgan present for visit

## 2017-06-05 NOTE — Progress Notes (Deleted)
   PRENATAL VISIT NOTE  Subjective:  Teresa Marsh is a 37 y.o. [phone] at [redacted]w[redacted]d being seen today for ongoing prenatal care.  She is currently monitored for the following issues for this {Blank single:19197::"high-risk","low-risk"} pregnancy and has Supervision of other normal pregnancy, antepartum; AMA (advanced maternal age) multigravida 35+; and Uterine size date discrepancy pregnancy, first trimester on their problem list.  Patient reports {sx:14538}.  Contractions: Not present. Vag. Bleeding: None.   . Denies leaking of fluid.   The following portions of the patient's history were reviewed and updated as appropriate: allergies, current medications, past family history, past medical history, past social history, past surgical history and problem list. Problem list updated.  Objective:   Vitals:   06/05/17 0824  BP: 115/76  Pulse: 89  Weight: 134 lb 14.4 oz (61.2 kg)    Fetal Status: Fetal Heart Rate (bpm): 156         General:  Alert, oriented and cooperative. Patient is in no acute distress.  Skin: Skin is warm and dry. No rash noted.   Cardiovascular: Normal heart rate noted  Respiratory: Normal respiratory effort, no problems with respiration noted  Abdomen: Soft, gravid, appropriate for gestational age.  Pain/Pressure: Present     Pelvic: {Blank single:19197::"Cervical exam performed","Cervical exam deferred"}        Extremities: Normal range of motion.  Edema: None  Mental Status: Normal mood and affect. Normal behavior. Normal judgment and thought content.   Assessment and Plan:  Pregnancy: [id number] at [redacted]w[redacted]d  1. [redacted] weeks gestation of pregnancy *** - [place] MFM OB DETAIL +14 WK; Future  2. AMA (advanced maternal age) multigravida 88+, second trimester *** - [place] MFM OB DETAIL +14 WK; Future  3. Encounter for antenatal screening for malformations *** - [place] MFM OB DETAIL +14 WK; Future  4. Supervision of other normal pregnancy, antepartum ***  5. Multigravida of  advanced maternal age in second trimester ***  {Blank single:19197::"Term","Preterm"} labor symptoms and general obstetric precautions including but not limited to vaginal bleeding, contractions, leaking of fluid and fetal movement were reviewed in detail with the patient. Please refer to After Visit Summary for other counseling recommendations.  Return in about 1 month (around 07/03/2017) for [place].  Future Appointments  Date Time Provider [place]  07/04/2017  8:00 AM [place] 3 WH-MFCUS MFC-US  07/04/2017  9:55 AM Jorje Guild, NP [place], [place]

## 2017-06-05 NOTE — Progress Notes (Deleted)
   PRENATAL VISIT NOTE  Subjective:  Rache Ashyah Quizon is a 37 y.o. [phone] at [redacted]w[redacted]d being seen today for ongoing prenatal care.  She is currently monitored for the following issues for this {Blank single:19197::"high-risk","low-risk"} pregnancy and has Supervision of other normal pregnancy, antepartum; AMA (advanced maternal age) multigravida 35+; and Uterine size date discrepancy pregnancy, first trimester on their problem list.  Patient reports {sx:14538}.  Contractions: Not present. Vag. Bleeding: None.   . Denies leaking of fluid.   The following portions of the patient's history were reviewed and updated as appropriate: allergies, current medications, past family history, past medical history, past social history, past surgical history and problem list. Problem list updated.  Objective:   Vitals:   06/05/17 0824  BP: 115/76  Pulse: 89  Weight: 134 lb 14.4 oz (61.2 kg)    Fetal Status: Fetal Heart Rate (bpm): 156         General:  Alert, oriented and cooperative. Patient is in no acute distress.  Skin: Skin is warm and dry. No rash noted.   Cardiovascular: Normal heart rate noted  Respiratory: Normal respiratory effort, no problems with respiration noted  Abdomen: Soft, gravid, appropriate for gestational age.  Pain/Pressure: Present     Pelvic: {Blank single:19197::"Cervical exam performed","Cervical exam deferred"}        Extremities: Normal range of motion.  Edema: None  Mental Status: Normal mood and affect. Normal behavior. Normal judgment and thought content.   Assessment and Plan:  Pregnancy: [id number] at [redacted]w[redacted]d  1. [redacted] weeks gestation of pregnancy *** - [place] MFM OB DETAIL +14 WK; Future  2. AMA (advanced maternal age) multigravida 52+, second trimester *** - [place] MFM OB DETAIL +14 WK; Future  3. Encounter for antenatal screening for malformations *** - [place] MFM OB DETAIL +14 WK; Future  4. Supervision of other normal pregnancy, antepartum ***  5. Multigravida of  advanced maternal age in second trimester ***  {Blank single:19197::"Term","Preterm"} labor symptoms and general obstetric precautions including but not limited to vaginal bleeding, contractions, leaking of fluid and fetal movement were reviewed in detail with the patient. Please refer to After Visit Summary for other counseling recommendations.  Return in about 1 month (around 07/03/2017) for [place].  Future Appointments  Date Time Provider [place]  07/04/2017  8:00 AM WH-MFC [place] 3 WH-MFCUS MFC-US    Valree Feild, [place]

## 2017-07-04 ENCOUNTER — Ambulatory Visit (HOSPITAL_COMMUNITY)
Admission: RE | Admit: 2017-07-04 | Discharge: 2017-07-04 | Disposition: A | Discharge status: Home / Self Care | Payer: Medicaid Other | Source: Ambulatory Visit | Attending: Advanced Practice Midwife | Admitting: Advanced Practice Midwife

## 2017-07-04 ENCOUNTER — Other Ambulatory Visit: Payer: Self-pay | Admitting: Advanced Practice Midwife

## 2017-07-04 ENCOUNTER — Encounter (HOSPITAL_COMMUNITY): Payer: Self-pay

## 2017-07-04 ENCOUNTER — Ambulatory Visit (INDEPENDENT_AMBULATORY_CARE_PROVIDER_SITE_OTHER): Payer: Medicaid Other | Admitting: Student

## 2017-07-04 VITALS — BP 117/74 | HR 92 | Wt 137.5 lb

## 2017-07-04 DIAGNOSIS — Z3482 Encounter for supervision of other normal pregnancy, second trimester: Secondary | ICD-10-CM

## 2017-07-04 DIAGNOSIS — D259 Leiomyoma of uterus, unspecified: Secondary | ICD-10-CM

## 2017-07-04 DIAGNOSIS — O09522 Supervision of elderly multigravida, second trimester: Secondary | ICD-10-CM

## 2017-07-04 DIAGNOSIS — Z758 Other problems related to medical facilities and other health care: Secondary | ICD-10-CM | POA: Insufficient documentation

## 2017-07-04 DIAGNOSIS — Z3A18 18 weeks gestation of pregnancy: Secondary | ICD-10-CM | POA: Diagnosis not present

## 2017-07-04 DIAGNOSIS — Z3480 Encounter for supervision of other normal pregnancy, unspecified trimester: Secondary | ICD-10-CM

## 2017-07-04 DIAGNOSIS — O26841 Uterine size-date discrepancy, first trimester: Secondary | ICD-10-CM

## 2017-07-04 DIAGNOSIS — O3412 Maternal care for benign tumor of corpus uteri, second trimester: Secondary | ICD-10-CM | POA: Insufficient documentation

## 2017-07-04 DIAGNOSIS — Z789 Other specified health status: Secondary | ICD-10-CM

## 2017-07-04 DIAGNOSIS — Z603 Acculturation difficulty: Secondary | ICD-10-CM

## 2017-07-04 DIAGNOSIS — Z363 Encounter for antenatal screening for malformations: Secondary | ICD-10-CM | POA: Insufficient documentation

## 2017-07-04 DIAGNOSIS — O26842 Uterine size-date discrepancy, second trimester: Secondary | ICD-10-CM | POA: Insufficient documentation

## 2017-07-04 NOTE — Progress Notes (Signed)
   PRENATAL VISIT NOTE  Subjective:  Teresa Marsh is a 37 y.o. [id number] at [redacted]w[redacted]d being seen today for ongoing prenatal care.  She is currently monitored for the following issues for this high-risk pregnancy and has Supervision of other normal pregnancy, antepartum; AMA (advanced maternal age) multigravida 35+; Uterine size date discrepancy pregnancy, first trimester; Placenta succenturiate lobe affecting fetus; and Language barrier on their problem list.  Patient reports no complaints.  Contractions: Not present. Vag. Bleeding: None.  Movement: Present. Denies leaking of fluid.   The following portions of the patient's history were reviewed and updated as appropriate: allergies, current medications, past family history, past medical history, past social history, past surgical history and problem list. Problem list updated.  Objective:   Vitals:   07/04/17 1030  BP: 117/74  Pulse: 92  Weight: 137 lb 8 oz (62.4 kg)    Fetal Status: Fetal Heart Rate (bpm): 154   Movement: Present   Fundal height at umbilicus  General:  Alert, oriented and cooperative. Patient is in no acute distress.  Skin: Skin is warm and dry. No rash noted.   Cardiovascular: Normal heart rate noted  Respiratory: Normal respiratory effort, no problems with respiration noted  Abdomen: Soft, gravid, appropriate for gestational age.  Pain/Pressure: Absent     Pelvic: Cervical exam deferred        Extremities: Normal range of motion.  Edema: None  Mental Status: Normal mood and affect. Normal behavior. Normal judgment and thought content.   Assessment and Plan:  Pregnancy: [id number] at [redacted]w[redacted]d  1. Supervision of other normal pregnancy, antepartum -doing well, no complaints -Reviewed results of anatomy ultrasound. Excited for a girl!. Has 2 boys at home -Previously declines genetic screening/counseling for AMA  2. Language barrier -[place] interpreter at bedside  Preterm labor symptoms and general obstetric  precautions including but not limited to vaginal bleeding, contractions, leaking of fluid and fetal movement were reviewed in detail with the patient. Please refer to After Visit Summary for other counseling recommendations.  Return in about 1 month (around 08/01/2017) for Routine OB.  No future appointments.  Jorje Guild, NP

## 2017-07-04 NOTE — Patient Instructions (Signed)
Cc bi?n php trnh [place] Contraception Choices Trnh thai, hay cn g?i l ki?m sot vi?c c thai, ?? c?p ??n cc bi?n php ho?c d?ng c? ng?n ng?a mang [place]. Ph??ng php hc mn Que c?y thu?c trnh [place] Que c?y thu?c trnh thai l m?t ?ng nh?a, m?ng ch?a hc mn. ?ng ???c lu?n vo ph?n cnh tay trn. ?ng c th? ? nguyn v? tr trong t?i ?a 3 n?m. Tim thu?c trnh [place] ch? ch?a progestin Tim thu?c trnh [place] ch? ch?a progestin l tim progestin, m?t d?ng t?ng h?p c?a hc mn progesterone. Thu?c ???c chuyn gia ch?m [place] s?c kh?e tim 3 thng m?t l?n. Thu?c vin trnh [place] Thu?c vin trnh thai l vin thu?c ch?a hc mn ng?n ng?a mang [place]. Qu v? ph?i u?ng thu?c m?i ngy m?t l?n, t?t nh?t l vo cng m?t th?i ?i?m m?i ngy. Mi?ng dn trnh [place] Mi?ng dn trnh [place] ch?a hc mn ng?n ng?a mang thai. Mi?ng dn ny ???c dn ln da v ph?i thay m?i tu?n m?t l?n trong ba tu?n v ???c lo?i b? vo tu?n th? t?. C?n m?t ??n thu?c ?? s? d?ng ph??ng php trnh [place] ny. Vng ??t m ??o Vng ??t m ??o ch?a hc mn ng?n ng?a mang [place]. Vng ???c ??t vo trong m ??o trong ba tu?n v ???c b? ra vo tu?n th? t?. Sau ?, qu trnh ???c l?p l?i v?i m?t vng m?i. C?n m?t ??n thu?c ?? s? d?ng ph??ng php trnh [place] ny. Thu?c trnh [place] kh?n c?p Thu?c trnh thai kh?n c?p ng?n ng?a mang thai sau khi quan h? tnh d?c khng c bi?n php b?o v?. Thu?c ? d?ng vin nn v c th? ???c dng t?i ?a 5 ngy sau khi quan h?. Cng u?ng s?m sau quan h? bao nhiu th cng c tc d?ng t?t b?y nhiu. H?u h?t cc thu?c trnh thai kh?n c?p ??u c bn m khng c?n k ??n. Khng ???c dng ph??ng php ny lm hnh th?c trnh [place] duy nh?t c?a qu v?. Ph??ng php mng ng?n [place] cao su nam [place] su nam l m?t bao m?ng ?eo trm ln d??ng v?t lc quan h? tnh d?c. Bao cao su ng?n tinh d?ch ?i vo trong c? th? ph? n?. Bao c th? ???c s? d?ng cng v?i ch?t di?t tinh trng ?? t?ng hi?u qu?Marland Kitchen Ph?i v?t b? sau m?t l?n s? d?ng. Mora Appl su n? Mora Appl su n? l m?t bao m?m, r?ng ???c ??a vo m ??o tr??c khi quan h? tnh d?c. Bao cao su ny ng?n tinh d?ch ?i vo trong c? th? ph? n?. Ph?i v?t b? sau m?t l?n s? d?ng. Mng ch?n Mng ch?n l m?t mng m?m, hnh vm. Mng ny ???c ??t vo trong m ??o tr??c khi quan h? tnh d?c, cng v?i ch?t di?t tinh trng. Mng ch?n ng?n tinh trng xm nh?p t? cung v ch?t di?t tinh trng s? tiu di?t tinh trng. Nn ?? mng ch?n trong m ??o trong 6-8 gi? sau khi quan h? v tho ra trong vng 24 gi?. Mng ch?n ph?i ???c k ??n v do chuyn gia ch?m [place] s?c kh?e ??t. Ph?i thay mng ch?n 1-2 n?m m?t l?n, sau khi sinh, sau khi t?ng trn 15 lb (6,8 kg) v sau khi ph?u thu?t vng ch?u. M? c? t? cung M? c? t? cung l m?t c?c nh?a ho?c cao su m?m, trn bao kht c? t? cung. M? ???c ??t vo trong m ??o tr??c khi  quan h? tnh d?c, cng v?i ch?t di?t tinh trng. M? ny ng?n tinh trng thm nh?p t? cung. M? c? t? cung ph?i ???c ?? nguyn trong 6-8 gi? sau khi quan h? v tho ra trong vng 48 gi?. M? c? t? cung ph?i ???c k ??n v do chuyn gia ch?m [place] s?c kh?e ??t. M? ph?i ???c thay 2 n?m m?t l?n. B?t bi?n  B?t bi?n l mi?ng b?t polyurethane hnh trn, m?m ch?a ch?t di?t tinh trng trn ?Marland Kitchen B?t bi?n gip ng?n tinh trng thm nh?p t? cung v ch?t di?t tinh trng s? tiu di?t tinh trng. ?? s? d?ng, hy lm ?m mi?ng b?t bi?n ? v sau ? ??a vo m ??o. Ph?i ??a vo m ??o tr??c khi quan h?, ?? l?i t?i thi?u 6 gi? sau khi quan h? v tho ra r?i v?t ?i trong vng 30 gi?Marland Kitchen Ch?t di?t tinh trng Ch?t di?t tinh trng l nh?ng ha ch?t di?t ho?c ng?n khng cho tinh trng xm nh?p vo c? t? cung v t? cung. Ch?t ny c th? ? d?ng kem, d?ng ?ng ??c, thu?c ??n, d?ng b?t, ho?c vin nn. Ch?t di?t tinh trng ph?i ???c ??a vo m ??o b?ng m?t d?ng c? ??t tr??c khi quan h? t?i thi?u 10-15 pht ?? c th?i gian cho thu?c tc d?ng. Qu trnh ny ph?i ???c l?p l?i m?i l?n qu v? quan h? tnh d?c. Thu?c di?t tinh trng khng c?n k ??n. Hessie Diener thai  trong t? cung Vng trnh [place] trong t? cung (IUD). IUD l d?ng c? hnh ch? T ???c ??t vo t? cung ph? n?. C 2 lo?i vng trnh thai:  IUD hc mn.Lo?i ny ch?a progestin, m?t d?ng t?ng h?p c?a hc mn progesterone. Lo?i ny c th? duy tr t?i ch? trong 3-5 n?m.  IUD b?ng ??ng.Lo?i ny ???c b?c b?ng dy ??ng. N c th? ? l?i t?i ch? trong 10 n?m.  Cc ph??ng php trnh [place] v?nh vi?n Th?t ?ng d?n tr?ng ? n? [place] ph??ng php ny, ?ng d?n tr?ng c?a ng??i ph? n? ???c b?t, th?t, ho?c ch?n l?i b?ng ph?u thu?t ?? ng?n khng cho tr?ng ?i vo t? cung. Tri?t sa?n b??ng n?i soi t?? cung [place] ph??ng php ny, m?t ?ng m?m, nh? ???c lu?n vo t?ng ?ng d?n tr?ng. Cc ?ng lu?n ny lm cho m s?o hnh thnh trong cc ?ng d?n tr?ng v lm t?c cc ?ng d?n tr?ng ?? tinh trng khng th? g?p tr?ng. Th? thu?t ny m?t kho?ng 3 thng ?? c hi?u qu?Sander Nephew v? ph?i s? d?ng m?t hnh th?c trnh [place] khc trong 3 thng ny. Tri?t s?n nam ?y l th? thu?t ?? th?t ?ng d?n tinh d?ch (th?t ?ng d?n tinh). Sau khi lm th? thu?t, nam gi?i v?n c th? xu?t ra ch?t l?ng (tinh d?ch). Cc ph??ng php k? ho?ch t? nhin K? ho?ch ha gia ?nh t? nhin [place] ph??ng php ny, c?p ?i khng quan h? tnh d?c vo nh?ng ngy ph? n? c th? th? [place]. Ph??ng php theo l?ch C ngh?a l theo di ?? di m?i chu k? kinh nguy?t, nh?n di?n nh?ng ngy c th? th? [place] v khng quan h? tnh d?c vo nh?ng ngy ?. Ph??ng php r?ng tr?ng [place] ph??ng php ny, c?p ?i trnh quan h? vo ngy r?ng tr?ng. Ph??ng php tri?u ch?ng - nhi?t (Symptothermal) Ph??ng php ny ngh?a l khng quan h? tnh d?c trong th?i gian r?ng tr?ng. Ph? n? th??ng ki?m tra tnh tr?ng r?ng tr?ng b?ng cch  theo di nh?ng thay ??i v? nhi?t ?? v ?? ??c c?a d?ch nh?y c? t? cung. Ph??ng php sau r?ng tr?ng [place] ph??ng php ny, c?p ?i s? ??i ??n khi h?t r?ng tr?ng ?? quan h? tnh d?c. Tm t?t  Trnh thai, hay cn g?i l ki?m sot vi?c c thai, c ngh?a l cc bi?n php ho?c d?ng  c? ?? ng?n ng?a mang [place].  Ph??ng php trnh [place] b?ng hc mn bao g?m que c?y, thu?c tim, thu?c d?ng vin nn, mi?ng dn, vng ??t m ??o v thu?c trnh [place] kh?n c?p.  Ph??ng php trnh [place] b?ng mng ng?n c th? bao g?m bao cao su nam, bao cao su n?, mng ch?n, m? c? t? cung, b?t bi?n v ch?t di?t tinh trng.  C hai lo?i IUD (vng trnh [place] trong t? cung). M?t IUD c th? ???c ??t trong t? cung ph? n? ?? ng?n ng?a mang thai trong 3-5 n?m.  Tri?t s?n v?nh vi?n c th? ???c th?c hi?n thng qua m?t th? thu?t trn nam gi?i, n? gi?i, ho?c c? hai.  Cc ph??ng php k? ho?ch ha gia ?nh t? nhin l khng quan h? tnh d?c vo nh?ng ngy ph? n? c th? th? [place]. Thng tin ny khng nh?m m?c ?ch thay th? cho l?i khuyn m chuyn gia ch?m [place] s?c kh?e ni v?i qu v?. Hy b?o ??m qu v? ph?i th?o lu?n b?t k? v?n ?? g m qu v? c v?i chuyn gia ch?m [place] s?c kh?e c?a qu v?. Document Released: 03/18/2015 Document Revised: 06/20/2016 Document Reviewed: 06/20/2016 Elsevier Interactive Patient Education  2018 [employer].

## 2017-07-04 NOTE — ED Notes (Signed)
[place] interpreter present.

## 2017-08-07 ENCOUNTER — Ambulatory Visit (INDEPENDENT_AMBULATORY_CARE_PROVIDER_SITE_OTHER): Payer: Medicaid Other | Admitting: Nurse Practitioner

## 2017-08-07 VITALS — BP 107/66 | HR 90 | Wt 141.5 lb

## 2017-08-07 DIAGNOSIS — O26841 Uterine size-date discrepancy, first trimester: Secondary | ICD-10-CM

## 2017-08-07 DIAGNOSIS — Z789 Other specified health status: Secondary | ICD-10-CM

## 2017-08-07 DIAGNOSIS — O09529 Supervision of elderly multigravida, unspecified trimester: Secondary | ICD-10-CM

## 2017-08-07 DIAGNOSIS — Z3480 Encounter for supervision of other normal pregnancy, unspecified trimester: Secondary | ICD-10-CM

## 2017-08-07 NOTE — Progress Notes (Signed)
    Subjective:  Teresa Marsh is a 37 y.o. [phone] at [redacted]w[redacted]d being seen today for ongoing prenatal care.  She is currently monitored for the following issues for this low-risk pregnancy and has Supervision of other normal pregnancy, antepartum; AMA (advanced maternal age) multigravida 35+; Uterine size date discrepancy pregnancy, first trimester; Placenta succenturiate lobe affecting fetus; and Language barrier on their problem list.  Client has hospital in person interpreter with her for the entire visit.  Patient reports no complaints.  Contractions: Irregular. Vag. Bleeding: None.  Movement: Present. Denies leaking of fluid.   Has decided to use the IUD for postpartum contraception - chart updated.  The following portions of the patient's history were reviewed and updated as appropriate: allergies, current medications, past family history, past medical history, past social history, past surgical history and problem list. Problem list updated.  Objective:   Vitals:   08/07/17 0903  BP: 107/66  Pulse: 90  Weight: 141 lb 8 oz (64.2 kg)    Fetal Status: Fetal Heart Rate (bpm): 148 Fundal Height: 27 cm Movement: Present     General:  Alert, oriented and cooperative. Patient is in no acute distress.  Skin: Skin is warm and dry. No rash noted.   Cardiovascular: Normal heart rate noted  Respiratory: Normal respiratory effort, no problems with respiration noted  Abdomen: Soft, gravid, appropriate for gestational age. Pain/Pressure: Absent     Pelvic:  Cervical exam deferred        Extremities: Normal range of motion.  Edema: None  Mental Status: Normal mood and affect. Normal behavior. Normal judgment and thought content.   Urinalysis:      Assessment and Plan:  Pregnancy: [id number] at [redacted]w[redacted]d  1. Supervision of other normal pregnancy, antepartum Doing well. Will have glucola at next visit.  2. Antepartum multigravida of advanced maternal age   70. Uterine size date discrepancy  pregnancy, first trimester Continue to monitor fundal height  Ultrasound showed several small fibroids and a Posterior placenta with an anterior succenturiate lobe.  4. Language barrier Interpreter present.  Preterm labor symptoms and general obstetric precautions including but not limited to vaginal bleeding, contractions, leaking of fluid and fetal movement were reviewed in detail with the patient. Please refer to After Visit Summary for other counseling recommendations.  Return in about 1 month (around 09/04/2017) for needs obfu/ 2 hr gtt after 6/23.  Earlie Server, RN, MSN, NP-BC Nurse Practitioner, [facility] for [employer], [place] [facility] 08/07/2017 9:20 AM

## 2017-09-04 ENCOUNTER — Ambulatory Visit (INDEPENDENT_AMBULATORY_CARE_PROVIDER_SITE_OTHER): Payer: Medicaid Other | Admitting: Advanced Practice Midwife

## 2017-09-04 ENCOUNTER — Other Ambulatory Visit: Payer: Medicaid Other

## 2017-09-04 ENCOUNTER — Other Ambulatory Visit: Payer: Self-pay

## 2017-09-04 VITALS — BP 93/71 | HR 96 | Wt 148.0 lb

## 2017-09-04 DIAGNOSIS — Z3480 Encounter for supervision of other normal pregnancy, unspecified trimester: Secondary | ICD-10-CM

## 2017-09-04 DIAGNOSIS — Z789 Other specified health status: Secondary | ICD-10-CM

## 2017-09-04 NOTE — Patient Instructions (Addendum)
Skin to Skin After delivery, the staff will place your baby on your chest. This helps with the following: . Regulates baby's temperature, breathing, heart rate and blood sugar . Increases Mom's milk supply . Promotes bonding . Keeps baby and Mom calm and decreases baby's crying     [email or web address] Go to this website and pre-register and start the birth certificate process

## 2017-09-04 NOTE — Progress Notes (Signed)
   PRENATAL VISIT NOTE  Subjective:  Teresa Marsh is a 37 y.o. [phone] at [redacted]w[redacted]d being seen today for ongoing prenatal care.  She is currently monitored for the following issues for this low-risk pregnancy and has Supervision of other normal pregnancy, antepartum; AMA (advanced maternal age) multigravida 35+; Uterine size date discrepancy pregnancy, first trimester; Placenta succenturiate lobe affecting fetus; and Language barrier on their problem list.  Patient reports no complaints.  Contractions: Not present. Vag. Bleeding: None.  Movement: Present. Denies leaking of fluid.   The following portions of the patient's history were reviewed and updated as appropriate: allergies, current medications, past family history, past medical history, past social history, past surgical history and problem list. Problem list updated.  Objective:   Vitals:   09/04/17 0828  BP: 93/71  Pulse: 96  Weight: 148 lb (67.1 kg)    Fetal Status: Fetal Heart Rate (bpm): 145   Movement: Present     General:  Alert, oriented and cooperative. Patient is in no acute distress.  Skin: Skin is warm and dry. No rash noted.   Cardiovascular: Normal heart rate noted  Respiratory: Normal respiratory effort, no problems with respiration noted  Abdomen: Soft, gravid, appropriate for gestational age.  Pain/Pressure: Absent     Pelvic: Cervical exam deferred        Extremities: Normal range of motion.  Edema: None  Mental Status: Normal mood and affect. Normal behavior. Normal judgment and thought content.   Assessment and Plan:  Pregnancy: [id number] at [redacted]w[redacted]d  1. Supervision of other normal pregnancy, antepartum --Anticipatory guidance about next visits/weeks of pregnancy given.  2. Language barrier --Hospital interpreter Marva Panda used for all communication.  Preterm labor symptoms and general obstetric precautions including but not limited to vaginal bleeding, contractions, leaking of fluid and fetal movement were  reviewed in detail with the patient. Please refer to After Visit Summary for other counseling recommendations.  Return in about 2 weeks (around 09/18/2017).  No future appointments.  Fatima Blank, CNM

## 2017-09-05 LAB — RPR: RPR: NONREACTIVE

## 2017-09-05 LAB — CBC
Hematocrit: 35.5 % (ref 34.0–46.6)
Hemoglobin: 12 g/dL (ref 11.1–15.9)
MCH: 32.6 pg (ref 26.6–33.0)
MCHC: 33.8 g/dL (ref 31.5–35.7)
MCV: 97 fL (ref 79–97)
Platelets: 214 10*3/uL (ref 150–450)
RBC: 3.68 x10E6/uL — ABNORMAL LOW (ref 3.77–5.28)
RDW: 13.5 % (ref 12.3–15.4)
WBC: 12.1 10*3/uL — ABNORMAL HIGH (ref 3.4–10.8)

## 2017-09-05 LAB — GLUCOSE TOLERANCE, 2 HOURS W/ 1HR
GLUCOSE, 1 HOUR: 154 mg/dL (ref 65–179)
GLUCOSE, 2 HOUR: 123 mg/dL (ref 65–152)
Glucose, Fasting: 71 mg/dL (ref 65–91)

## 2017-09-05 LAB — HIV ANTIBODY (ROUTINE TESTING W REFLEX): HIV SCREEN 4TH GENERATION: NONREACTIVE

## 2017-09-25 ENCOUNTER — Encounter (HOSPITAL_COMMUNITY): Payer: Self-pay

## 2017-09-25 ENCOUNTER — Encounter: Payer: Self-pay | Admitting: Nurse Practitioner

## 2017-09-25 ENCOUNTER — Ambulatory Visit (INDEPENDENT_AMBULATORY_CARE_PROVIDER_SITE_OTHER): Payer: Medicaid Other | Admitting: Nurse Practitioner

## 2017-09-25 ENCOUNTER — Ambulatory Visit (HOSPITAL_COMMUNITY)
Admission: RE | Admit: 2017-09-25 | Discharge: 2017-09-25 | Disposition: A | Discharge status: Home / Self Care | Payer: Medicaid Other | Source: Ambulatory Visit | Attending: Nurse Practitioner | Admitting: Nurse Practitioner

## 2017-09-25 VITALS — BP 103/78 | HR 92 | Wt 153.0 lb

## 2017-09-25 DIAGNOSIS — O26843 Uterine size-date discrepancy, third trimester: Secondary | ICD-10-CM | POA: Insufficient documentation

## 2017-09-25 DIAGNOSIS — Z23 Encounter for immunization: Secondary | ICD-10-CM | POA: Diagnosis not present

## 2017-09-25 DIAGNOSIS — O09523 Supervision of elderly multigravida, third trimester: Secondary | ICD-10-CM

## 2017-09-25 DIAGNOSIS — Z3A30 30 weeks gestation of pregnancy: Secondary | ICD-10-CM

## 2017-09-25 DIAGNOSIS — O3413 Maternal care for benign tumor of corpus uteri, third trimester: Secondary | ICD-10-CM

## 2017-09-25 DIAGNOSIS — Z3480 Encounter for supervision of other normal pregnancy, unspecified trimester: Secondary | ICD-10-CM

## 2017-09-25 DIAGNOSIS — Z362 Encounter for other antenatal screening follow-up: Secondary | ICD-10-CM

## 2017-09-25 DIAGNOSIS — D259 Leiomyoma of uterus, unspecified: Secondary | ICD-10-CM | POA: Insufficient documentation

## 2017-09-25 DIAGNOSIS — O3410 Maternal care for benign tumor of corpus uteri, unspecified trimester: Secondary | ICD-10-CM

## 2017-09-25 NOTE — Progress Notes (Signed)
    Subjective:  Teresa Marsh is a 37 y.o. [phone] at [redacted]w[redacted]d being seen today for ongoing prenatal care.  She is currently monitored for the following issues for this low-risk pregnancy and has Supervision of other normal pregnancy, antepartum; AMA (advanced maternal age) multigravida 35+; Uterine size date discrepancy pregnancy, third trimester; Placenta succenturiate lobe affecting fetus; and Language barrier on their problem list.  Video interpreter used for the entire visit.  Patient reports no complaints.  Contractions: Irritability. Vag. Bleeding: None.  Movement: Present. Denies leaking of fluid. Needs to change next appointment as she cannot come on Aug. 7.  The following portions of the patient's history were reviewed and updated as appropriate: allergies, current medications, past family history, past medical history, past social history, past surgical history and problem list. Problem list updated.  Objective:   Vitals:   09/25/17 0851  BP: 103/78  Pulse: 92  Weight: 153 lb (69.4 kg)    Fetal Status: Fetal Heart Rate (bpm): 153 Fundal Height: 35 cm Movement: Present     General:  Alert, oriented and cooperative. Patient is in no acute distress.  Skin: Skin is warm and dry. No rash noted.   Cardiovascular: Normal heart rate noted  Respiratory: Normal respiratory effort, no problems with respiration noted  Abdomen: Soft, gravid, appropriate for gestational age. Pain/Pressure: Present     Pelvic:  Cervical exam deferred        Extremities: Normal range of motion.  Edema: None  Mental Status: Normal mood and affect. Normal behavior. Normal judgment and thought content.   Urinalysis:      Assessment and Plan:  Pregnancy: [id number] at [redacted]w[redacted]d  1. Uterine size date discrepancy pregnancy, first trimester Measuring 5 cm over - will order ultrasound for interval growth and AFI today  2. Supervision of other normal pregnancy, antepartum  - Tdap vaccine greater than or equal to  7yo IM  3. Placenta succenturiate lobe affecting fetus   4. Multigravida of advanced maternal age in third trimester Plans IUD for contraception - does not want more children  Preterm labor symptoms and general obstetric precautions including but not limited to vaginal bleeding, contractions, leaking of fluid and fetal movement were reviewed in detail with the patient. Please refer to After Visit Summary for other counseling recommendations.  Return for please change next appointment, cannot come on Aug 7.  Earlie Server, RN, MSN, NP-BC Nurse Practitioner, [facility] for [employer], [place] [facility] 09/25/2017 9:18 AM

## 2017-09-25 NOTE — Patient Instructions (Addendum)
V??c xin Tdap (U?n vn, b?ch h?u v ho g): Quy? vi? c?n bi?t ?i?u gi? (Tdap Vaccine [Tetanus, Diphtheria and Pertussis]: What You Need to Know) 1. T?i sao pha?i tim v??c xin? U?n vn, b?ch h?u v ho g la? nh??ng b?nh r?t nghim tro?ng. V??c xin Tdap c th? b?o v? chng ta kh?i b? nh?ng b?nh na?y. V, v??c xin Tdap tim cho ph? n? mang thai c th? b?o v? tr? s? sinh kho?i b? ho g. U?N VN (Khi?t ha?m) hi?m g??p ? [place]? ngy nay. B?nh gy ra c?ng va? c??ng c? gy ?au ???n, th???ng la? ?? kh?p c? th?.  B?nh c th? d?n ??n c?ng c? ? ??u v c? v v?y, quy? vi? khng th? m? mi?ng, nu?t, ho?c ?i khi th?m chi? khng th? th? ???c. U?n vn gy t?? vong cho kho?ng 1 trong s? 10 ng??i bi? nhi?m b?nh ngay ca? sau khi ???c ch?m [place] y t? t?t nh?t. BA?[place] H?U c?ng hi?m g??p ? [place]? ngy nay. B?nh c th? gy ra m?t l?p ph? dy hi?nh tha?nh ? thnh sau h?ng.  B?nh c th? d?n ??n ca?c v?n ?? v? th?, suy tim, li?t v t?? vong. HO GA? (ho ga?) gy ra nh??ng ???t ho nghim tr?ng c th? gy kh th?, nn m?a v a?nh h???ng ??n gi?c ng?.  B?nh c?ng c th? d?n ??n gi?m cn, m?t t?? chu? v gy x??ng s??n. Co? ??n 2 trong s? 100 thanh thi?u nin v 5 trong s? 100 ng???i l??n b? ho g pha?i nh?p vi?n ho?c c cc bi?n ch?ng c th? bao g?m vim ph?i ho?c t? vong. Nh??ng b?nh ny do vi khu?n gy ra. B?nh b?ch h?u v ho g ly truy?n t? ng??i ny sang ng??i thng qua cc ch?t ti?t do ho ho?c h?t h?i. U?n vn xm nh?p c? th? qua v?t c?t, v?t tr?y x??c ho?c v?t th??ng. Tr??c khi co? v?c-xin, co? t??i 200.000 tr???ng h??p b?nh b?ch h?u, 200.000 tr??ng h?p ho g v hng tr?m tr??ng h?p b?nh u?n vn ? ???c bo co t?i M? m?i n?m. K? t? khi b?t ??u tim ch?ng, bo co v? cc tr??ng h?p b?nh u?n vn v b?ch h?u ? gi?m kho?ng 99% v ho g gia?m kho?ng 80%. 2. V?c xin Tdap V??c xin Tdap c th? b?o v? thanh thi?u nin v ng??i l?n kho?i b?nh u?n vn, b?ch h?u va? ho g. M?t li?u Tdap th??ng ????c tim ? tu?i 11  ho?c 12. Nh??ng ng??i ?a? khng ???c tim Tdap ? ?? tu?i ?o? c?n ????c tim cng s?m cng t?t. Tdap l ??c bi?t quan tr?ng v?i cc chuyn gia ch?m [place] y t? v b?t c? ai c ti?p xu?c g?n gu?i v?i m?t em b d??i 12 thng tu?i. Ph? n? mang thai nn ???c tim m?t li?u Tdap trong m?i l?n mang thai ?? b?o v? tr? s? sinh kho?i b? ho g. Tr? nho? co? nguy c? cao nh?t bi? bi?n ch?ng nghim tr?ng, ?e d?a ma?ng s?ng do ho g. M?t lo?i v?c xin khc, c tn l Td, pho?ng b?nh u?n vn v b?ch h?u, nh?ng khng pho?ng b?nh ho g. M?t li?u Td t?ng c???ng nn ???c tim 10 n?m m?t l?n. Tdap c th? ???c tim nh? m?t trong nh??ng li?u t?ng c???ng na?y n?u quy? vi? ch?a ???c tim Tdap tr??c ?y. Tdap c?ng c th? ???c tim sau khi bi? m?t v?t c?t ho?c v?t bo?ng nghim tro?ng ?? trnh nhi?m b?nh u?n  vn. Bc s? c?a quy? vi? ho?c ng??i tim v?c xin cho quy? vi? c th? cung c?p cho quy? vi? thm thng tin. Tdap co? th? ????c tim m?t cch an ton cng m?t lc nh? nh??ng v?c xin khc. 3. M?t s? ng???i khng nn tim v??c xin na?y.  M?t ng??i ? t?ng c m?t ph?n ?ng d? ?ng ?e do?a ti?nh ma?ng sau khi tim m?t li?u v??c xin b?ch h?u, u?n vn ho?c ho g, HO??C c m?t d? ?ng nghim tr?ng v?i b?t k? tha?nh ph?n no c?a v?c xin ny, khng ????c tim v??c xin Tdap. Ni v?i ng??i tim v?c xin v? b?t k? d? ?ng nghim tr?ng na?o.  B?t c? ai ?a? bi? hn m ho?c co gi?t ke?o da?i l?p ?i l?p l?i trong vng 7 ngy sau khi tim m?t li?u DTP ho?c DTaP t?? khi co?n nho?, ho?c m?t li?u Tdap tr??c ?y, khng nn tim Tdap, tr? khi m?t nguyn nhn khc ????c pha?t hi?n khng pha?i do v?c-xin. H? v?n c th? ???c tim Td.  Hy ni v?i bc s? n?u quy? vi?: ? bi? co gi?t ho?c c m?t v?n ?? kha?c ? h? th?n kinh, ? bi? ?au ho??c s?ng nghim tro?ng sau khi tim b?t k? v?c xin b?ch h?u, u?n vn hay ho g na?o, ? t??ng bi? m?t ti?nh tra?ng g?i l h?i ch?ng Guillain-Barr (GBS), ? khng ca?m th?y kho?e trong ngy co? k? ho?ch tim  chu?ng.  4. Nguy c? V?i b?t k? loa?i thu?c na?o, bao g?m c? v?c xin, ??u c nguy c? co? tc d?ng ph?. Nh??ng ta?c du?ng phu? na?y th??ng la? nh? v t?? h?t. Nh??ng ph?n ?ng nghim tr?ng c?ng c th? c nh?ng r?t hi?m. H?u h?t nh?ng ng??i ???c tim v?c xin Tdap khng c b?t k? v?n ?? gi? v?i v?c xin ny. Nh??ng v?n ?? nh? sau khi tim Tdap (Khng gy ca?n tr?? sinh ho?t)  ?au ?? ch? tim (kho?ng 3 trong s? 4 thanh thi?u nin ho?c 2 trong s? 3 ng??i l?n)  ?? ho?c s?ng ?? ch? tim (kho?ng 1 trong s? 5 ng???i)  S?t nh? t nh?t 100,4 F (t??i kho?ng 1 trong s? 52 thanh thi?u nin ho?c 1 trong s? 100 ng??i l?n)  Nh?c ??u (kho?ng 3 ho?c 4 trong s? 10 ng???i)  M?t m?i (kho?ng 1 trong s? 3 ho?c 4 ng???i)  Bu?n nn, nn m??a, tiu cha?y, ?au bu?ng (t??i 1 trong s? 4 thanh thi?u nin ho??c 1 trong s? 10 ng???i l??n)  ?n l?nh, ?au kh?p (kho?ng 1 trong s? 10 ng???i)  ?au nh?c c? th? (kho?ng 1 trong s? 3 ho?c 4 ng???i)  Pha?t ban, s?ng ha?ch (b?t th??ng) Nh??ng v?n ?? v?a ph?i sau khi tim Tdap (Gy ca?n tr?? sinh ho?t, nh?ng khng ?i h?i ch?m [place] y t?)  ?au ?? ch? tim (t??i 1 trong s? 5 hay 6 ng???i)  ?? ho?c s?ng ?? ch? tim (??n kho?ng 1 trong s? 90 thanh thi?u nin ho?c 1 trong s? 12 ng??i l?n)  S?t trn 102F (khoa?ng 1 trong s? 100 thanh thi?u nin ho??c 1 trong s? 250 ng???i l??n)  Nh?c ??u (kho?ng 1 trong s? 7 thanh thi?u nin ho?c 1 trong s? 10 ng??i l?n)  Bu?n nn, nn m?a, tiu ch?y, ?au b?ng (ln ??n 1 ho?c 3 trong s? 100 ng???i)  S?ng toa?n b? cnh tay bi? tim (ln ??n kho?ng 1 trong s? 500 ng???i). Nh??ng v?n ?? nghim tr?ng sau khi tim  Tdap (Khng th? th?c hi?n sinh ho?t bnh th??ng; c?n pha?i ch?m [place] y t?)  S?ng, ?au n??ng, ch?y mu v t?y ?? ? cnh tay bi? tim (hi?m g??p). Nh??ng v?n ?? c th? x?y ra sau khi tim b?t k? v?c xin na?o:  Ng??i ta ?i khi bi? ng?t sau m?t thu? thu?t y t?, bao g?m c? tim ch?ng. Ng?i ho?c n?m  xu?ng kho?ng 15 pht c th? gip tra?nh bi? ng?t v ch?n th??ng do nga?. Ni v?i bc s? n?u quy? vi? c?m th?y chng m?t, ho?c c nh?ng thay ??i th? l?c ho?c  tai.  M?t s? ng??i bi? ?au d? d?i ? vai v g?p kh kh?n trong vi?c c?? ??ng bn cnh tay bi? tim. ?i?u ny r?t hi?m khi x?y ra.  B?t k? thu?c na?o cu?ng c th? gy ra m?t ph?n ?ng d? ?ng nghim tr?ng. Nh?ng ph?n ?ng nh? v?y do m?t v?c xin l r?t hi?m, ??c tnh co? t h?n 1 trong s? m?t tri?u li?u v s? x?y ra trong vng m?t vi pht ??n vi gi? sau khi tim ch?ng. Nh? v?i b?t k? thu?c na?o, co? m?t kha? n?ng r?t nho? l m?t v?c xin gy ra th??ng tch nghim tr?ng ho?c t? vong. ?? an ton c?a v?c xin lun lun ???c theo di. ?? bi?t thm thng tin, hy truy c?p: [email or web address] 5. N?u co? m?t v?n ?? nghim tr?ng thi? sao? Ti c?n tm g?  Tm b?t c? ?i?u g la?m quy? vi? lo l??ng, ch?ng h?n nh? cc d?u hi?u c?a m?t ph?n ?ng d? ?ng nghim tr?ng, s?t r?t cao ho?c hnh vi b?t th??ng.  D?u hi?u c?a m?t ph?n ?ng d? ?ng nghim tr?ng c th? bao g?m pht ban, s?ng m?t v c? h?ng, kh th?, nh?p tim nhanh, chng m?t v y?u. Nh??ng d?u hi?u na?y th??ng b?t ??u vi pht ??n vi gi? sau khi tim ch?ng. Ti nn lm g?  N?u quy? vi? ngh? r?ng ? l m?t ph?n ?ng d? ?ng nghim tr?ng ho?c tr??ng h?p kh?n c?p kha?c khng th? ch? ??i, hy g?i 9-1-1 ho?c ??a ng??i ?o? ??n b?nh vi?n g?n nh?t. N?u khng, hy g?i bc s? c?a quy? vi?.  Sau ?, ph?n ?ng c?n ???c bo ca?o cho H? th?ng Ba?o ca?o S?? c? V??c xin (VAERS). Bc s? c?a quy? vi? c th? g?i bo co ny, ho?c quy? vi? c th? t?? g??i thng qua trang web cu?a VAERS ta?i [email or web address], ho??c b??ng ca?ch go?i [phone]. VAERS khng cung c?p t? v?n y t?. 6. Ch??ng tri?nh B?i th???ng Th??ng t?t do V??c xin Qu?c gia Ch??ng tri?nh B?i th???ng Th??ng t?t do V?c xin Qu?c gia (VICP) l m?t ch??ng trnh lin bang ? ???c ??a ra ?? b?i th???ng cho nh?ng ng??i ? b? th??ng t?t do  loa?i v?c xin no ?. Nh??ng ng???i tin r??ng ho? co? th? ?a? bi? th??ng t?t do m?t loa?i v??c xin co? th? ti?m hi?u v? ch??ng tri?nh va? v? ca?ch n?p ??n khi?u na?i b??ng ca?ch go?i [phone] ho??c truy c?p trang web cu?a VICP ta?i [email or web address]. Co? m?t gi?i h?n th?i gian cho vi?c n?p m?t yu c?u b?i th??ng. 7. Ti c th? tm hi?u thm nh? th? na?o?  Hy h?i bc s? c?a quy? vi?. Ba?c si? c th? cung c?p cho quy? vi? t? h??ng d?n s? d?ng trong bao b c?a v?c xin ho??c khuy?n ngh? cc ngu?n thng tin kha?c.  G?i cho s? y t? ??a ph??ng ho?c  ti?u bang c?a quy? vi?.  Lin h? v?i cc Trung tm Ki?m sot va? Pho?ng ng??a D?ch b?nh (CDC): ? Go?i [phone] (1-800-CDC-INFO) ho??c ? Truy c?p vo website c?a CDC t?i [email or web address] CDC Tdap Vaccine VIS (04/28/13) Thng tin ny khng nh?m m?c ?ch thay th? cho l?i khuyn m chuyn gia ch?m [place] s?c kh?e ni v?i qu v?. Hy b?o ??m qu v? ph?i th?o lu?n b?t k? v?n ?? g m qu v? c v?i chuyn gia ch?m [place] s?c kh?e c?a qu v?. Document Released: 06/13/2015 Document Revised: 06/13/2015 Document Reviewed: 06/03/2013 Elsevier Interactive Patient Education  2017 Elsevier Inc.  T?m quan tr?ng c?a m?t ch?t t?t T?ng truy?n s?a cho b - b b ?? s?a ??m b?o b?n c ?? s?a cho b Gi?m ?au nm v Don1 s? d?ng nm v gi? v bnh s?a - nh?ng th? ny khi?n b b khc v?i b m? Thc ??y ti?p t?c cho con b  ?ang ? trong Em b c?a b?n s? ? trong phng v?i b?n trong ton b? th?i gian b?n ? trong b?nh vi?n. ?i?u ny gip v?i nh?ng ?i?u sau ?y: Cho php m? h?c cc tn hi?u cho b ?n - ?i m?t rung ??ng. - Mt l??i ho?c tay - Root (m? mi?ng v quay ??u) - Rc vo v - ??a tay ln mi?ng Cho php cho con b theo nhu c?u (khi em b c?a b?n ? s?n sng) Gip b bnh t?nh v hi lng ??m b?o cung c?p s?a t?t Ng?n ng?a cc bi?n ch?ng khi cho con b Cho php cha m? h?c cch ch?m [place] em b Cho php b?n yu c?u h? tr? cho  con b

## 2017-10-09 ENCOUNTER — Encounter: Payer: Medicaid Other | Admitting: Obstetrics & Gynecology

## 2017-10-09 ENCOUNTER — Encounter: Payer: Medicaid Other | Admitting: Obstetrics and Gynecology

## 2017-10-23 ENCOUNTER — Ambulatory Visit (INDEPENDENT_AMBULATORY_CARE_PROVIDER_SITE_OTHER): Payer: Medicaid Other | Admitting: Advanced Practice Midwife

## 2017-10-23 VITALS — BP 107/70 | HR 91 | Wt 157.7 lb

## 2017-10-23 DIAGNOSIS — Z3483 Encounter for supervision of other normal pregnancy, third trimester: Secondary | ICD-10-CM

## 2017-10-23 DIAGNOSIS — O09523 Supervision of elderly multigravida, third trimester: Secondary | ICD-10-CM

## 2017-10-23 DIAGNOSIS — Z789 Other specified health status: Secondary | ICD-10-CM

## 2017-10-23 DIAGNOSIS — Z758 Other problems related to medical facilities and other health care: Secondary | ICD-10-CM

## 2017-10-23 DIAGNOSIS — O26843 Uterine size-date discrepancy, third trimester: Secondary | ICD-10-CM

## 2017-10-23 NOTE — Patient Instructions (Signed)
Braxton Hicks Contractions °Contractions of the uterus can occur throughout pregnancy, but they are not always a sign that you are in labor. You may have practice contractions called Braxton Hicks contractions. These false labor contractions are sometimes confused with true labor. °What are Braxton Hicks contractions? °Braxton Hicks contractions are tightening movements that occur in the muscles of the uterus before labor. Unlike true labor contractions, these contractions do not result in opening (dilation) and thinning of the cervix. Toward the end of pregnancy (32-34 weeks), Braxton Hicks contractions can happen more often and may become stronger. These contractions are sometimes difficult to tell apart from true labor because they can be very uncomfortable. You should not feel embarrassed if you go to the hospital with false labor. °Sometimes, the only way to tell if you are in true labor is for your health care provider to look for changes in the cervix. The health care provider will do a physical exam and may monitor your contractions. If you are not in true labor, the exam should show that your cervix is not dilating and your water has not broken. °If there are other health problems associated with your pregnancy, it is completely safe for you to be sent home with false labor. You may continue to have Braxton Hicks contractions until you go into true labor. °How to tell the difference between true labor and false labor °True labor °· Contractions last 30-70 seconds. °· Contractions become very regular. °· Discomfort is usually felt in the top of the uterus, and it spreads to the lower abdomen and low back. °· Contractions do not go away with walking. °· Contractions usually become more intense and increase in frequency. °· The cervix dilates and gets thinner. °False labor °· Contractions are usually shorter and not as strong as true labor contractions. °· Contractions are usually irregular. °· Contractions  are often felt in the front of the lower abdomen and in the groin. °· Contractions may go away when you walk around or change positions while lying down. °· Contractions get weaker and are shorter-lasting as time goes on. °· The cervix usually does not dilate or become thin. °Follow these instructions at home: °· Take over-the-counter and prescription medicines only as told by your health care provider. °· Keep up with your usual exercises and follow other instructions from your health care provider. °· Eat and drink lightly if you think you are going into labor. °· If Braxton Hicks contractions are making you uncomfortable: °? Change your position from lying down or resting to walking, or change from walking to resting. °? Sit and rest in a tub of warm water. °? Drink enough fluid to keep your urine pale yellow. Dehydration may cause these contractions. °? Do slow and deep breathing several times an hour. °· Keep all follow-up prenatal visits as told by your health care provider. This is important. °Contact a health care provider if: °· You have a fever. °· You have continuous pain in your abdomen. °Get help right away if: °· Your contractions become stronger, more regular, and closer together. °· You have fluid leaking or gushing from your vagina. °· You pass blood-tinged mucus (bloody show). °· You have bleeding from your vagina. °· You have low back pain that you never had before. °· You feel your baby’s head pushing down and causing pelvic pressure. °· Your baby is not moving inside you as much as it used to. °Summary °· Contractions that occur before labor are called Braxton   Hicks contractions, false labor, or practice contractions. °· Braxton Hicks contractions are usually shorter, weaker, farther apart, and less regular than true labor contractions. True labor contractions usually become progressively stronger and regular and they become more frequent. °· Manage discomfort from Braxton Hicks contractions by  changing position, resting in a warm bath, drinking plenty of water, or practicing deep breathing. °This information is not intended to replace advice given to you by your health care provider. Make sure you discuss any questions you have with your health care provider. °Document Released: 07/05/2016 Document Revised: 07/05/2016 Document Reviewed: 07/05/2016 °Elsevier Interactive Patient Education © 2018 Elsevier Inc. ° °

## 2017-10-23 NOTE — Progress Notes (Signed)
   PRENATAL VISIT NOTE  Subjective:  Teresa Marsh is a 37 y.o. [phone] at [redacted]w[redacted]d being seen today for ongoing prenatal care.  She is currently monitored for the following issues for this low-risk pregnancy and has Supervision of other normal pregnancy, antepartum; AMA (advanced maternal age) multigravida 35+; Uterine size date discrepancy pregnancy, third trimester; Language barrier; and Uterine fibroids affecting pregnancy on their problem list.  Patient reports no complaints.  Contractions: Irritability. Vag. Bleeding: None.  Movement: Present. Denies leaking of fluid.   The following portions of the patient's history were reviewed and updated as appropriate: allergies, current medications, past family history, past medical history, past social history, past surgical history and problem list. Problem list updated.  Objective:   Vitals:   10/23/17 0832  BP: 107/70  Pulse: 91  Weight: 157 lb 11.2 oz (71.5 kg)    Fetal Status: Fetal Heart Rate (bpm): 140 Fundal Height: 35 cm Movement: Present     General:  Alert, oriented and cooperative. Patient is in no acute distress.  Skin: Skin is warm and dry. No rash noted.   Cardiovascular: Normal heart rate noted  Respiratory: Normal respiratory effort, no problems with respiration noted  Abdomen: Soft, gravid, appropriate for gestational age.  Pain/Pressure: Present     Pelvic: Cervical exam deferred        Extremities: Normal range of motion.  Edema: None  Mental Status: Normal mood and affect. Normal behavior. Normal judgment and thought content.   Assessment and Plan:  Pregnancy: [id number] at [redacted]w[redacted]d  1. Encounter for supervision of other normal pregnancy in third trimester --No complaints or concerns, continue routine care --Discussed GBS and GC/C next visit   2. Uterine size date discrepancy pregnancy, third trimester --Fundal height within normal limits today --No concerning findings, EFW 47% by [place] 09/25/17  3. Placenta  succenturiate lobe affecting fetus --Resolved per [place] 09/25/17  4. Supervision of elderly multigravida in third trimester --Confirms today she still desires IUD  5. Language barrier --Medical interpreter present for all patient interaction  Preterm labor symptoms and general obstetric precautions including but not limited to vaginal bleeding, contractions, leaking of fluid and fetal movement were reviewed in detail with the patient. Please refer to After Visit Summary for other counseling recommendations.  Return in about 2 weeks (around 11/06/2017).  No future appointments.  Darlina Rumpf, CNM  10/23/17  8:43 AM

## 2017-11-06 ENCOUNTER — Ambulatory Visit (INDEPENDENT_AMBULATORY_CARE_PROVIDER_SITE_OTHER): Payer: Medicaid Other | Admitting: Advanced Practice Midwife

## 2017-11-06 ENCOUNTER — Other Ambulatory Visit (HOSPITAL_COMMUNITY)
Admission: RE | Admit: 2017-11-06 | Discharge: 2017-11-06 | Disposition: A | Discharge status: Home / Self Care | Payer: Medicaid Other | Source: Ambulatory Visit | Attending: Advanced Practice Midwife | Admitting: Advanced Practice Midwife

## 2017-11-06 VITALS — BP 113/83 | HR 103 | Wt 160.1 lb

## 2017-11-06 DIAGNOSIS — Z3483 Encounter for supervision of other normal pregnancy, third trimester: Secondary | ICD-10-CM

## 2017-11-06 DIAGNOSIS — Z789 Other specified health status: Secondary | ICD-10-CM

## 2017-11-06 DIAGNOSIS — O09523 Supervision of elderly multigravida, third trimester: Secondary | ICD-10-CM

## 2017-11-06 DIAGNOSIS — Z3A36 36 weeks gestation of pregnancy: Secondary | ICD-10-CM | POA: Insufficient documentation

## 2017-11-06 NOTE — Progress Notes (Signed)
   PRENATAL VISIT NOTE  Subjective:  Teresa Marsh is a 37 y.o. [phone] at [redacted]w[redacted]d being seen today for ongoing prenatal care.  She is currently monitored for the following issues for this low-risk pregnancy and has Supervision of other normal pregnancy, antepartum; AMA (advanced maternal age) multigravida 35+; Uterine size date discrepancy pregnancy, third trimester; Language barrier; and Uterine fibroids affecting pregnancy on their problem list.  Patient reports occasional contractions.  Contractions: Irregular. Vag. Bleeding: None.  Movement: Present. Denies leaking of fluid.   The following portions of the patient's history were reviewed and updated as appropriate: allergies, current medications, past family history, past medical history, past social history, past surgical history and problem list. Problem list updated.  Objective:   Vitals:   11/06/17 0933  BP: 113/83  Pulse: (!) 103  Weight: 160 lb 1.6 oz (72.6 kg)    Fetal Status: Fetal Heart Rate (bpm): 148   Movement: Present     General:  Alert, oriented and cooperative. Patient is in no acute distress.  Skin: Skin is warm and dry. No rash noted.   Cardiovascular: Normal heart rate noted  Respiratory: Normal respiratory effort, no problems with respiration noted  Abdomen: Soft, gravid, appropriate for gestational age.  Pain/Pressure: Present     Pelvic: Cervical exam performed closed/thick/posterior        Extremities: Normal range of motion.  Edema: None  Mental Status: Normal mood and affect. Normal behavior. Normal judgment and thought content.   Assessment and Plan:  Pregnancy: [id number] at [redacted]w[redacted]d  1. Encounter for supervision of other normal pregnancy in third trimester --Reviewed Braxton Hicks contractions, manage with rest and hydration - Culture, beta strep (group b only) - GC/Chlamydia probe amp ([place])not at [facility] --Reviewed reasons to seek care at [facility] prior to next LOB  2. Supervision of elderly  multigravida in third trimester   3. Language barrier --iPad interpreter used for all patient interaction  Preterm labor symptoms and general obstetric precautions including but not limited to vaginal bleeding, contractions, leaking of fluid and fetal movement were reviewed in detail with the patient. Please refer to After Visit Summary for other counseling recommendations.  Return in about 1 week (around 11/13/2017).  No future appointments.  Darlina Rumpf, CNM

## 2017-11-06 NOTE — Patient Instructions (Signed)
Vaginal Delivery Vaginal delivery means that you will give birth by pushing your baby out of your birth canal (vagina). A team of health care providers will help you before, during, and after vaginal delivery. Birth experiences are unique for every woman and every pregnancy, and birth experiences vary depending on where you choose to give birth. What should I do to prepare for my baby's birth? Before your baby is born, it is important to talk with your health care provider about:  Your labor and delivery preferences. These may include: ? Medicines that you may be given. ? How you will manage your pain. This might include non-medical pain relief techniques or injectable pain relief such as epidural analgesia. ? How you and your baby will be monitored during labor and delivery. ? Who may be in the labor and delivery room with you. ? Your feelings about surgical delivery of your baby (cesarean delivery, or C-section) if this becomes necessary. ? Your feelings about receiving donated blood through an IV tube (blood transfusion) if this becomes necessary.  Whether you are able: ? To take pictures or videos of the birth. ? To eat during labor and delivery. ? To move around, walk, or change positions during labor and delivery.  What to expect after your baby is born, such as: ? Whether delayed umbilical cord clamping and cutting is offered. ? Who will care for your baby right after birth. ? Medicines or tests that may be recommended for your baby. ? Whether breastfeeding is supported in your hospital or birth center. ? How long you will be in the hospital or birth center.  How any medical conditions you have may affect your baby or your labor and delivery experience.  To prepare for your baby's birth, you should also:  Attend all of your health care visits before delivery (prenatal visits) as recommended by your health care provider. This is important.  Prepare your home for your baby's  arrival. Make sure that you have: ? Diapers. ? Baby clothing. ? Feeding equipment. ? Safe sleeping arrangements for you and your baby.  Install a car seat in your vehicle. Have your car seat checked by a certified car seat installer to make sure that it is installed safely.  Think about who will help you with your new baby at home for at least the first several weeks after delivery.  What can I expect when I arrive at the birth center or hospital? Once you are in labor and have been admitted into the hospital or birth center, your health care provider may:  Review your pregnancy history and any concerns you have.  Insert an IV tube into one of your veins. This is used to give you fluids and medicines.  Check your blood pressure, pulse, temperature, and heart rate (vital signs).  Check whether your bag of water (amniotic sac) has broken (ruptured).  Talk with you about your birth plan and discuss pain control options.  Monitoring Your health care provider may monitor your contractions (uterine monitoring) and your baby's heart rate (fetal monitoring). You may need to be monitored:  Often, but not continuously (intermittently).  All the time or for long periods at a time (continuously). Continuous monitoring may be needed if: ? You are taking certain medicines, such as medicine to relieve pain or make your contractions stronger. ? You have pregnancy or labor complications.  Monitoring may be done by:  Placing a special stethoscope or a handheld monitoring device on your abdomen to   check your baby's heartbeat, and feeling your abdomen for contractions. This method of monitoring does not continuously record your baby's heartbeat or your contractions.  Placing monitors on your abdomen (external monitors) to record your baby's heartbeat and the frequency and length of contractions. You may not have to wear external monitors all the time.  Placing monitors inside of your uterus  (internal monitors) to record your baby's heartbeat and the frequency, length, and strength of your contractions. ? Your health care provider may use internal monitors if he or she needs more information about the strength of your contractions or your baby's heart rate. ? Internal monitors are put in place by passing a thin, flexible wire through your vagina and into your uterus. Depending on the type of monitor, it may remain in your uterus or on your baby's head until birth. ? Your health care provider will discuss the benefits and risks of internal monitoring with you and will ask for your permission before inserting the monitors.  Telemetry. This is a type of continuous monitoring that can be done with external or internal monitors. Instead of having to stay in bed, you are able to move around during telemetry. Ask your health care provider if telemetry is an option for you.  Physical exam Your health care provider may perform a physical exam. This may include:  Checking whether your baby is positioned: ? With the head toward your vagina (head-down). This is most common. ? With the head toward the top of your uterus (head-up or breech). If your baby is in a breech position, your health care provider may try to turn your baby to a head-down position so you can deliver vaginally. If it does not seem that your baby can be born vaginally, your provider may recommend surgery to deliver your baby. In rare cases, you may be able to deliver vaginally if your baby is head-up (breech delivery). ? Lying sideways (transverse). Babies that are lying sideways cannot be delivered vaginally.  Checking your cervix to determine: ? Whether it is thinning out (effacing). ? Whether it is opening up (dilating). ? How low your baby has moved into your birth canal.  What are the three stages of labor and delivery?  Normal labor and delivery is divided into the following three stages: Stage 1  Stage 1 is the  longest stage of labor, and it can last for hours or days. Stage 1 includes: ? Early labor. This is when contractions may be irregular, or regular and mild. Generally, early labor contractions are more than 10 minutes apart. ? Active labor. This is when contractions get longer, more regular, more frequent, and more intense. ? The transition phase. This is when contractions happen very close together, are very intense, and may last longer than during any other part of labor.  Contractions generally feel mild, infrequent, and irregular at first. They get stronger, more frequent (about every 2-3 minutes), and more regular as you progress from early labor through active labor and transition.  Many women progress through stage 1 naturally, but you may need help to continue making progress. If this happens, your health care provider may talk with you about: ? Rupturing your amniotic sac if it has not ruptured yet. ? Giving you medicine to help make your contractions stronger and more frequent.  Stage 1 ends when your cervix is completely dilated to 4 inches (10 cm) and completely effaced. This happens at the end of the transition phase. Stage 2  Once   your cervix is completely effaced and dilated to 4 inches (10 cm), you may start to feel an urge to push. It is common for the body to naturally take a rest before feeling the urge to push, especially if you received an epidural or certain other pain medicines. This rest period may last for up to 1-2 hours, depending on your unique labor experience.  During stage 2, contractions are generally less painful, because pushing helps relieve contraction pain. Instead of contraction pain, you may feel stretching and burning pain, especially when the widest part of your baby's head passes through the vaginal opening (crowning).  Your health care provider will closely monitor your pushing progress and your baby's progress through the vagina during stage 2.  Your  health care provider may massage the area of skin between your vaginal opening and anus (perineum) or apply warm compresses to your perineum. This helps it stretch as the baby's head starts to crown, which can help prevent perineal tearing. ? In some cases, an incision may be made in your perineum (episiotomy) to allow the baby to pass through the vaginal opening. An episiotomy helps to make the opening of the vagina larger to allow more room for the baby to fit through.  It is very important to breathe and focus so your health care provider can control the delivery of your baby's head. Your health care provider may have you decrease the intensity of your pushing, to help prevent perineal tearing.  After delivery of your baby's head, the shoulders and the rest of the body generally deliver very quickly and without difficulty.  Once your baby is delivered, the umbilical cord may be cut right away, or this may be delayed for 1-2 minutes, depending on your baby's health. This may vary among health care providers, hospitals, and birth centers.  If you and your baby are healthy enough, your baby may be placed on your chest or abdomen to help maintain the baby's temperature and to help you bond with each other. Some mothers and babies start breastfeeding at this time. Your health care team will dry your baby and help keep your baby warm during this time.  Your baby may need immediate care if he or she: ? Showed signs of distress during labor. ? Has a medical condition. ? Was born too early (prematurely). ? Had a bowel movement before birth (meconium). ? Shows signs of difficulty transitioning from being inside the uterus to being outside of the uterus. If you are planning to breastfeed, your health care team will help you begin a feeding. Stage 3  The third stage of labor starts immediately after the birth of your baby and ends after you deliver the placenta. The placenta is an organ that develops  during pregnancy to provide oxygen and nutrients to your baby in the womb.  Delivering the placenta may require some pushing, and you may have mild contractions. Breastfeeding can stimulate contractions to help you deliver the placenta.  After the placenta is delivered, your uterus should tighten (contract) and become firm. This helps to stop bleeding in your uterus. To help your uterus contract and to control bleeding, your health care provider may: ? Give you medicine by injection, through an IV tube, by mouth, or through your rectum (rectally). ? Massage your abdomen or perform a vaginal exam to remove any blood clots that are left in your uterus. ? Empty your bladder by placing a thin, flexible tube (catheter) into your bladder. ? Encourage   you to breastfeed your baby. After labor is over, you and your baby will be monitored closely to ensure that you are both healthy until you are ready to go home. Your health care team will teach you how to care for yourself and your baby. This information is not intended to replace advice given to you by your health care provider. Make sure you discuss any questions you have with your health care provider. Document Released: 11/29/2007 Document Revised: 09/09/2015 Document Reviewed: 03/06/2015 [employer] Interactive Patient Education  2018 [employer]  

## 2017-11-07 LAB — GC/CHLAMYDIA PROBE AMP (~~LOC~~) NOT AT ARMC
Chlamydia: NEGATIVE
NEISSERIA GONORRHEA: NEGATIVE

## 2017-11-09 LAB — CULTURE, BETA STREP (GROUP B ONLY): STREP GP B CULTURE: NEGATIVE

## 2017-11-13 ENCOUNTER — Ambulatory Visit (INDEPENDENT_AMBULATORY_CARE_PROVIDER_SITE_OTHER): Payer: Medicaid Other | Admitting: Student

## 2017-11-13 VITALS — BP 113/76 | HR 95 | Wt 161.0 lb

## 2017-11-13 DIAGNOSIS — Z789 Other specified health status: Secondary | ICD-10-CM

## 2017-11-13 DIAGNOSIS — Z3483 Encounter for supervision of other normal pregnancy, third trimester: Secondary | ICD-10-CM

## 2017-11-13 DIAGNOSIS — Z3480 Encounter for supervision of other normal pregnancy, unspecified trimester: Secondary | ICD-10-CM

## 2017-11-13 NOTE — Progress Notes (Signed)
   PRENATAL VISIT NOTE  Subjective:  Teresa Marsh is a 37 y.o. [phone] at [redacted]w[redacted]d being seen today for ongoing prenatal care.  She is currently monitored for the following issues for this low-risk pregnancy and has Supervision of other normal pregnancy, antepartum; AMA (advanced maternal age) multigravida 35+; Uterine size date discrepancy pregnancy, third trimester; Language barrier; and Uterine fibroids affecting pregnancy on their problem list.  Patient reports no complaints.  Contractions: Irregular. Vag. Bleeding: None.  Movement: Present. Denies leaking of fluid.   The following portions of the patient's history were reviewed and updated as appropriate: allergies, current medications, past family history, past medical history, past social history, past surgical history and problem list. Problem list updated.  Objective:   Vitals:   11/13/17 1011  BP: 113/76  Pulse: 95  Weight: 161 lb (73 kg)    Fetal Status: Fetal Heart Rate (bpm): 152   Movement: Present  Presentation: Transverse  General:  Alert, oriented and cooperative. Patient is in no acute distress.  Skin: Skin is warm and dry. No rash noted.   Cardiovascular: Normal heart rate noted  Respiratory: Normal respiratory effort, no problems with respiration noted  Abdomen: Soft, gravid, appropriate for gestational age.  Pain/Pressure: Present     Pelvic: Cervical exam performed Dilation: Fingertip Effacement (%): Thick Station: -3  Extremities: Normal range of motion.  Edema: Moderate pitting, indentation subsides rapidly  Mental Status: Normal mood and affect. Normal behavior. Normal judgment and thought content.    Assessment and Plan:  Pregnancy: [id number] at [redacted]w[redacted]d  1. Supervision of other normal pregnancy, antepartum -Transverse (maternal left) by bedside ultrasound. D/w Dr. Harolyn Rutherford. Patient scheduled for ECV with Dr. Roselie Awkward this Friday at [facility] with patient, she is agreeable. Discussed to present to  hospital Friday at 745 am  2. Language barrier -video [place] interpreter used  Term labor symptoms and general obstetric precautions including but not limited to vaginal bleeding, contractions, leaking of fluid and fetal movement were reviewed in detail with the patient. Please refer to After Visit Summary for other counseling recommendations.  Return in about 1 week (around 11/20/2017) for Routine OB.  Future Appointments  Date Time Provider [place]  11/15/2017  8:00 AM [facility] None  11/21/2017  8:35 AM Luvenia Redden, PA-C [facility]    Jorje Guild, NP

## 2017-11-13 NOTE — Patient Instructions (Addendum)
Come to hospital this Friday (September 13) at 745 am for your external cephalic version    Thch th?c trong nui con b?ng s?a m? v cc gi?i php (Breastfeeding Challenges and Solutions) M?c d nui con b?ng s?a m? l qu trnh t? nhin, n c th? l s? thch th?c, ??c bi?t l trong nh?ng tu?n ??u tin sau khi sinh. Nh?ng v?n ?? n?y sinh khi b?t ??u cho tr? s? sinh b l bnh th??ng, ngay c? khi qu v? ? t?ng cho con b tr??c ?y. Ti li?u ny cho bi?t m?t s? gi?i php cho nh?ng thch th?c ph? bi?n nh?t khi cho con b. THCH TH?C V GI?I PHP Thch th?c-Nm v n?t ho?c lot Cc b m? cho con b th??ng b? n?t ho?c lot nm v. N?t ho?c lot nm v th??ng do khng ng?m v ?? su (khi mi?ng em b ng?m vo b?u v c?a qu v? ?? b) Tnh tr?ng lot c?ng c th? x?y ra n?u con qu v? khng ???c ??t vo t? th? ph h?p ? b?u v c?a qu v?. M?c d tnh tr?ng n?t ho?c lot ? nm v l ph? bi?n trong tu?n ??u sau sinh, ?au nm v khng bao gi? l bnh th??ng. N?u qu v? b? n?t ho?c lot nm v ko di h?n 1 tu?n ho?c b? ?au nm v, hy g?i cho chuyn gia ch?m [place] s?c kh?e ho?c chuyn gia t? v?n v? cch cho b. Gi?i php B?o ??m vi?c ng?m v ?ng cch v ??t con qu v? ? t? th? ph h?p b?ng cch lm theo nh?ng b??c sau:  Tm m?t ch? thu?n ti?n ?? ng?i ho?c n?m, c? v l?ng ???c ??m c?n th?n.  ??t m?t chi?c g?i ho?c cu?n t?m ch?n d??i em b ?? cho b ? ngang t?m b?u v c?a qu v? (n?u qu v? ng?i).  B?o ??m cho b?ng con qu v? h??ng vo b?ng qu v?.  Nh? nhng mt-xa b?u v c?a qu v?. Dng ??u ngn tay xoa bp t? thnh ng?c v? pha nm v theo chuy?n ??ng trn. Vi?c ny thc ??y dng s?a ch?y ra. Qu v? c th? c?n ti?p t?c ??ng tc ny trong lc cho con b n?u s?a ch?y ch?m.  ?? b?u v c?a qu v? b?ng 4 ngn tay ??t d??i v ngn ci ? pha trn nm v. ??m b?o cho cc ngn tay c?a qu v? ??t ? xa nm v v mi?ng b.  Dng ngn tay ho?c nm v c?a qu v? vu?t nh? mi b.  Khi mi?ng b m? ?? r?ng, nhanh  chng m con v? pha b?u v c?a qu v?, ??t ton b? nm v v vng s?m mu quanh nm v (qu?ng v) vo mi?ng b cng nhi?u cng t?t. ? T?t h?n l c th? nhn th?y qu?ng v pha trn mi trn c?a b nhi?u h?n qu?ng v pha d??i mi d??i c?a b. ? L??i b nn ? gi?a l?i d??i c?a b v b?u v c?a qu v?.  ??m b?o cho mi?ng b ???c ??t ?ng v? tr xung quanh nm v (ng?m v). Mi b c?n ng?m kn b?u v c?a qu v? v cha ra (l?n ra).  Thng th??ng con qu v? c th? b trong kho?ng 2 - 3 pht ?? dng s?a b?t ??u ch?y ra. D?u hi?u cho th?y b ng?m v ?ng cch bao g?m:  Ko ho?c mt khng gy ti?ng ??ng v khng lm qu v? ?au.  Nghe th?y ti?ng nu?t gi?a 3-4 l?n mt.  S? v?n ??ng c?a c? pha trn v pha tr??c c?a tai b khi mt. Cc d?u hi?u cho th?y con qu v? ch?a ng?m ?ng vo nm v bao g?m:  Ti?ng b ho?c ti?ng chp chp c?a b khi b.  Nm v ?au. B?o ??m r?ng v qu v? ???c gi? ?m v kh?e m?nh b?ng cch:  Hessie Diener s? d?ng x phng trn nm v.  M?c o ng?c h? tr?. Trnh m?c o ng?c c dy kim lo?i ho?c lo?i qu ch?t.  ?? nm v kh t? nhin trong 3-4 pht sau m?i l?n cho b.  Ch? s? d?ng mi?ng ??m o ng?c bng ?? th?m s?a m? r? ra. R? s?a m? gi?a cc l?n cho b l bnh th??ng. B?o ??m thay mi?ng ??m n?u chng ? b? th?m ??t s?a.  S? d?ng lanolin trn nm v sau khi cho b. Lanolin gip duy tr l?p ?m bnh th??ng c?a da. N?u qu v? dng lanolin tinh khi?t, qu v? khng c?n r?a nm v tr??c khi cho b l?i. Lanolin tinh khi?t khng ??c h?i cho con qu v?. Qu v? c?ng c th? dng tay v?t vi gi?t s?a v nh? nhng xoa m?y gi?t s?a ? vo nm v v ?? cho nm v kh ngoi khng kh. Thch th?c-C??ng s?a ? v Ng?c c??ng s?a l tnh tr?ng qu c?ng s?a trong v. Trong nh?ng tu?n ??u sau khi sinh, qu v? c th? b? c??ng s?a ? v. V c??ng s?a c th? lm v b? ?au nhi v c?m th?y c?ng, c?ng c?ng, ?m, v c?m gic ?au. C??ng s?a nhi?u nh?t l vo kho?ng ngy th? n?m sau khi sinh. B? c?ng s?a khng  c ngh?a l qu v? ph?i d?ng vi?c cho con b. Gi?i php  Cho b khi qu v? c?m th?y c?n gi?m ?? c?ng ??y c?a b?u v ho?c khi con qu v? c d?u hi?u ?i. Vi?c ny g?i l "cho b theo nhu c?u".  Tr? s? sinh (tr? d??i 4 tu?n tu?i) th??ng b 1-3 ti?ng m?t l?n trong ngy. Qu v? c th? c?n ?nh th?c con qu v? d?y ?? cho b n?u b ng? thi?p ?i trong th?i gian b.  Khng ?? con qu v? ng? h?n 5 gi? vo ban ?m m khng cho b.  Ht ho?c v?t s?a b?ng tay tr??c khi cho b ?? lm m?m b?u v, qu?ng v v nm v.  Lm ?m, ?m (khi t?m ho?c b?ng kh?n tay nhng n??c ?m) ngay tr??c khi cho con b ho?c ht s?a, ho?c mt xa b?u v c?a qu v? tr??c khi ho?c trong lc cho con b. Lm nh? v?y s? t?ng s? l?u thng v gip s?a ch?y.  Cho b ho?c ht h?t s?a hon ton m?i l?n cho b ho?c ht s?a. Sau ?, m?c o ng?c ch?t (lo?i dnh cho b m? cho con b ho?c lo?i th??ng) trong 1-2 ngy ?? pht tn hi?u cho c? th? qu v? gi?m s?n sinh s?a m?t cht. Ch? s? d?ng p ng?c ch?t ?? x? l tnh tr?ng c??ng s?a. Cc b m? cho con b c?n th??ng nn trnh o n?t ng?c ch?t. Khi tnh tr?ng c?ng s?a gi?m b?t, quay tr? l?i dng qu?n o thng th??ng, l?ng.  Ch??m ti ? l?nh vo ng?c qu v? ?? gi?m ?au do c??ng s?a v gi?m s?ng, tr? khi ? l?nh lm qu v? kh ch?u.  Khng  tr hon vi?c cho b. C? g?ng th? gin khi ??n lc cho con b. Vi?c ny gip kh?i pht "ph?n x? s?a v?" ?? gi?i phng s?a ra kh?i v qu v?.  ??m b?o cho b ng?m v v ???c ??t ?ng t? th? trong khi b.  Cho php con qu v? v?n b ? m?t bn v c?a qu v? ??n ch?ng no b cn ng?m su v b tch c?c. Con qu v? s? cho qu v? bi?t khi no b b xong b?ng cch nh? v ra v ng? thi?p ?i.  Hessie Diener cho dng chai s?a ho?c nm v gi? trong nh?ng tu?n ??u cho con b. ??i cho dng nh?ng th? ny cho ??n khi gi?i quy?t xong nh?ng thch th?c trong vi?c cho con b.  C? g?ng ht s?a cng m?t l?ch trnh nh? khi con qu v? b n?u qu v? quay tr? l?i lm vi?c ho?c khng ? nh m?t  th?i gian di.  U?ng nhi?u n??c ?? trnh m?t n??c v m?t n??c c th? lm t?ng nguy c? gy c??ng s?a cho qu v?. N?u qu v? lm theo nh?ng ?? xu?t ny, tnh tr?ng c??ng s?a s? c?i thi?n trong vng 24-48 gi?. N?u qu v? v?n g?p ph?i kh kh?n, hy g?i cho chuyn gia t? v?n v? cch cho b ho?c chuyn gia ch?m [place] s?c kh?e. Thch th?c-T?c ?ng d?n s?a T?c ?ng d?n s?a x?y ra khi ?ng ny khng d?n s?a hi?u qu? v b? s?ng. Vi?c m?c o ng?c cho con b ch?t ho?c vi?c ng?m v kh kh?n c th? gy t?c ?ng d?n s?a. Khng u?ng ?? n??c (8-10 c [1,9-2,4 L] m?i ngy) c th? gp ph?n gy t?c ?ng d?n s?a. Khi ?ng d?n s?a b? t?c, ng?c c?a qu v? c th? c kh?i u c?ng, ?au nh?c v t?y ??. Gi?i php Khng  tr hon vi?c cho b. Cho b b th??ng xuyn v c? g?ng cho b h?t s?a m?i l?n cho b. C? g?ng cho con b ? bn b? ?nh h??ng tr??c ?? t?ng kh? n?ng s?a s? ???c b h?t hon ton t? bn ng?c ?. ??p kh?n ?m, ?m ln ng?c qu v? t? 5-10 pht tr??c khi cho b. N?u khng, hy t?m n??c nng ngay tr??c khi cho b c th? t?o ?? ?m v ?m lm thc ??y dng s?a ch?y ra. [place] bp nh? ch? ?au tr??c v trong khi cho b c?ng c th? c tc d?ng. Trnh m?c o ng?c ho?c qu?n o qu ch?t lm gy p l?c ln ng?c qu v?. M?c o ng?c m c th? h? tr? t?t cho ng?c c?a qu v?. N?u qu v? b? t?c ?ng d?n s?a v b? s?t, qu v? c?n ??n g?p chuyn gia ch?m [place] s?c kh?e. Thch th?c-Vim v Vim v l tnh tr?ng vim m v. Vim v th??ng do nhi?m khu?n gy ra v c th? gy cc tri?u ch?ng gi?ng nh? cm. Qu v? c th? b? t?y ?? ? v v b? s?t. Khi b? vim v, ng?c qu v? s? c?ng, ?m, v r?t ?au. Nh?ng nguyn nhn ph? bi?n nh?t gy vim v l ng?m v khng t?t, b mt khng hi?u qu?, p l?c lin t?c ln ng?c (c th? do m?c o n?t ng?c ho?c o ch?t lm h?n ch? dng ch?y s?a), c?ng th?ng b?t th??ng ho?c m?t m?i, ho?c b? c? b. Gi?i php Qu v? s? ???c k thu?c khng sinh ?? ?i?u tr? nhi?m trng.  Vi?c cho b th??ng xuyn ?? l?y h?t s?a trong v v?n l vi?c quan  tr?ng. Ti?p t?c cho con b khi qu v? kh?i vim v s? khng gy h?i cho con qu v?. B?o ??m cho con qu v? ???c ??t ?ng t? th? trong m?i l?n cho b. Lm ?m, ?m ng?c trong m?t vi pht tr??c khi cho b ?? gip dng s?a ch?y ra v lm ki?t s?a d? dng h?n. Thch th?c-N?m candida B?nh n?m candida l m?t b?nh nhi?m trng n?m men c th? hnh thnh trn nm v, trn ng?c ho?c trong mi?ng c?a b. B?nh ny gy ng?a, ?au nh?c, nng rt hay ?au bu?t v ?i khi pht ban. Gi?i php Qu v? s? ???c cho dng thu?c m? thoa nm v, v con qu v? s? ???c cho dng m?t lo?i thu?c d?ng n??c ?? ch?a t?a mi?ng. ?i?u quan tr?ng l qu v? v con qu v? ph?i ???c ?i?u tr? cng m?t lc v n?m candida c th? ly lan gi?a m? v con. Th??ng xuyn thay ??i t?m lt cho b dng m?t l?n. B?t k? o n?t ng?c ho?c lo?i o no ti?p xc v?i vng b? nhi?m khu?n c?a c? th? qu v? ho?c con qu v? ??u c?n ph?i gi?t b?ng n??c r?t nng m?i ngy. Th??ng xuyn r?a tay qu v? v tay con qu v?. C?n ?un si t?t c? nm v gi?, nm v c?a bnh b ho?c ?? ch?i m con qu v? cho vo mi?ng, trong 20 pht, m?t l?n m?i ngy. Sau 1 tu?n ?i?u tr?, hy v?t b? nm v gi? v nm v bnh v mua nm m?i. C?n ?un si t?t c? cc b? ph?n c?a my ht s?a ti?p xc v?i s?a trong 20 pht m?i ngy. Thch th?c-Thi?u s?a Qu v? c th? khng t?o ra ?? s?a n?u con qu v? khng t?ng cn ph h?p. Vi?c s?n sinh s?a m? l d?a vo h? th?ng cung c?u. Ngu?n s?a c?a qu v? ph? thu?c vo m?c ?? th??ng xuyn v hi?u qu? c?a vi?c b b s?ch s?a. Gi?i php Qu v? cng cho con b v ht nhi?u s?a, s?a cng s?n sinh nhi?u. ?i?u quan tr?ng l b b h?t t nh?t m?t bn v c?a qu v? vo m?i l?n b. N?u b khng b ???c nh? v?y, hy s? d?ng my ht s?a ho?c v?t b?ng tay s? s?a cn l?i. Vi?c ny s? gip l?y ra cng nhi?u s?a cng t?t m?i l?n cho con b. N c?ng g?i tn hi?u cho c? th? qu v? s?n sinh nhi?u s?a h?n. N?u b khng b h?t s?a, c th? l do b ng?m v, b, ho?c n?m ? t? th? khng ?ng.  N?u tnh tr?ng thi?u s?a ti?p tuc sau khi kh?c ph?c nh?ng v?n ?? ny, hy lin h? v?i chuyn gia ch?m [place] s?c kh?e ho?c chuyn gia t? v?n v? cch cho b cng s?m cng t?t. Thch th?c-Nm v t?t ho?c ph?ng M?t s? ph? n? c nm v t?t vo trong thay v nh ra bn ngoi. M?t s? ph? n? khc l?i c nm v ph?ng. Nm v t?t ho?c ph?ng ?i khi lm con qu v? kh kh?n trong vi?c ng?m vo v. Gi?i php Qu v? c th? ???c cho s? d?ng m?t thi?t b? nh? ?? ko nm v ra. Thi?t b? ny c?n ???c p d?ng ngay tr??c khi b ???c ??a ??n b?u v c?a qu v?. Qu v? c?ng c th?  th? s? d?ng my ht s?a trong m?t th?i gian ng?n tr??c khi cho b ng?m vo b?u v qu v?. My ht c th? ko nm v ra ngoi ?? gip b ng?m d? dng h?n. ??ng tc mt c?a b c?ng s? gip nm v b? t?t nh ra. N?u qu v? c nm v ph?ng, hy khuy?n khch b ng?m v v b th??ng xuyn trong nh?ng ngy ??u sau khi sinh. ?i?u ny s? gip b t?p ng?m v ?ng cch trong khi b?u v v?n cn m?m. Khi ngu?n cung c?p s?a t?ng, gi?a ngy th? hai v th? n?m sau khi sinh v b?u v ??y, con qu v? s? c th?i gian ng?m v d? dng h?n. Lin h? v?i m?t chuyn gia t? v?n v? cch cho b n?u qu v? v?n cn th?c m?c. Chuyn gia ? c th? d?y qu v? nh?ng k? thu?t b? sung ?? gi?i quy?t nh?ng v?n ?? trong nui con b?ng s?a m? lin quan ??n hnh d?ng v v? tri c?a nm v. ?? BI?T THM THNG TIN [employer]: www.llli.org Thng tin ny khng nh?m m?c ?ch thay th? cho l?i khuyn m chuyn gia ch?m [place] s?c kh?e ni v?i qu v?. Hy b?o ??m qu v? ph?i th?o lu?n b?t k? v?n ?? g m qu v? c v?i chuyn gia ch?m [place] s?c kh?e c?a qu v?. Document Released: 03/24/2010 Document Revised: 03/12/2014 Document Reviewed: 08/15/2012 Elsevier Interactive Patient Education  2017 [employer].

## 2017-11-14 ENCOUNTER — Telehealth (HOSPITAL_COMMUNITY): Payer: Self-pay | Admitting: *Deleted

## 2017-11-14 NOTE — Telephone Encounter (Signed)
[place] interpreter number  Preadmission screen

## 2017-11-15 ENCOUNTER — Encounter (HOSPITAL_COMMUNITY): Payer: Self-pay

## 2017-11-15 ENCOUNTER — Observation Stay (HOSPITAL_COMMUNITY)
Admission: RE | Admit: 2017-11-15 | Discharge: 2017-11-15 | Disposition: A | Discharge status: Home / Self Care | Payer: Medicaid Other | Source: Ambulatory Visit | Attending: Obstetrics & Gynecology | Admitting: Obstetrics & Gynecology

## 2017-11-15 DIAGNOSIS — O26843 Uterine size-date discrepancy, third trimester: Secondary | ICD-10-CM

## 2017-11-15 DIAGNOSIS — Z3A37 37 weeks gestation of pregnancy: Secondary | ICD-10-CM | POA: Insufficient documentation

## 2017-11-15 DIAGNOSIS — O321XX0 Maternal care for breech presentation, not applicable or unspecified: Secondary | ICD-10-CM | POA: Diagnosis not present

## 2017-11-15 MED ORDER — TERBUTALINE SULFATE 1 MG/ML IJ SOLN
INTRAMUSCULAR | Status: AC
  Filled 2017-11-15: qty 1

## 2017-11-15 MED ORDER — TERBUTALINE SULFATE 1 MG/ML IJ SOLN
0.2500 mg | Freq: Once | INTRAMUSCULAR | Status: AC
  Administered 2017-11-15: 0.25 mg via SUBCUTANEOUS

## 2017-11-15 NOTE — Progress Notes (Signed)
Patient discharged home following ECV.  Patient educated via interpreter about ROM, Bleeding, Decreased FM and if she has concerns to return to [facility].  Follow up appointment on 9/19 in the Clinic.

## 2017-11-15 NOTE — Progress Notes (Signed)
External Cephalic Version  Preprocedure Diagnosis:  37 y.o. G4P2 at 5.[redacted] weeks gestational age with breech presentation  Post-procedure Diagnosis: 37 y.o. G4P2 at 68.[redacted] weeks gestational age with cephalic presentation  Procedure:  Successful version  Procedure in detail:   The patient was brought to Labor and Delivery where a reactive fetal heart tracing was obtained. The patient was noted to have irregular contractions. She was given 1 dose of subcutaneous terbutaline which resolved her contraction. A bedside ultrasound was performed which revealed single intrauterine pregnancy and breech presentation. There was noted to be adequate fluid. Using manual pressure, the breech was manipulated in a forward roll fashion until a vertex presentation was obtained. Fetal heart tones were checked intermittently during the procedure and were noted to be reassuring. Following successful external cephalic version, the patient was placed on continuous external fetal monitoring. She was noted to have a reassuring and reactive tracing for 1 hour following the external cephalic version. She did not have regular contractions and therefore she was felt to be stable for discharge to home. She was given appropriate labor instructions. Patient ID: Teresa Marsh, female   DOB: 28-Oct-1980, 37 y.o.   MRN: [id number]   Woodroe Mode, MD 11/15/2017 10:02 AM

## 2017-11-15 NOTE — H&P (Signed)
Teresa Marsh is a 37 y.o. female presenting for [id number] [redacted]w[redacted]d Breech presentation for ECV. OB History    Gravida  4   Para  2   Term  2   Preterm      AB  1   Living  2     SAB  1   TAB      Ectopic      Multiple      Live Births  2          History reviewed. No pertinent past medical history. History reviewed. No pertinent surgical history. Family History: family history is not on file. Social History:  reports that she has never smoked. She has never used smokeless tobacco. She reports that she does not drink alcohol or use drugs.     Maternal Diabetes: No Genetic Screening: Declined Maternal Ultrasounds/Referrals: Normal Fetal Ultrasounds or other Referrals:  None Maternal Substance Abuse:  No Significant Maternal Medications:  None Significant Maternal Lab Results:  None Other Comments:  breech  ROS Maternal Medical History:  Reason for admission: Breech presentation  Fetal activity: Perceived fetal activity is normal.   Last perceived fetal movement was within the past hour.    Prenatal complications: no prenatal complications Prenatal Complications - Diabetes: none.      Blood pressure 106/68, pulse (!) 101, last menstrual period 02/24/2017, unknown if currently breastfeeding. Maternal Exam:  Abdomen: Patient reports no abdominal tenderness. Fetal presentation: breech US  Shows breech presentation with normal AF  Introitus: not evaluated.     Physical Exam  Vitals reviewed. Constitutional: She is oriented to person, place, and time. She appears well-developed. No distress.  Cardiovascular: Normal rate.  Respiratory: Effort normal.  Neurological: She is alert and oriented to person, place, and time.  Skin: Skin is warm and dry.  Psychiatric: She has a normal mood and affect. Her behavior is normal.    Prenatal labs: ABO, Rh: O/Positive/-- (03/06 1013) Antibody: Negative (03/06 1013) Rubella: 21.30 (03/06 1013) RPR: Non Reactive  (07/03 0821)  HBsAg: Negative (03/06 1013)  HIV: Non Reactive (07/03 0821)  GBS:     Assessment/Plan: Breech presentation for ECV, the risk of emergency delivery, labor or ROM, failure, discomfort, hospital admission were discussed with interpreter present and questions were answered and she signed consent   Emeterio Reeve 11/15/2017, 9:19 AM

## 2017-11-15 NOTE — Discharge Summary (Signed)
Physician Discharge Summary  Patient ID: Teresa Marsh MRN: [id number] DOB/AGE: [birth year] 37 y.o.  Admit date: 11/15/2017 Discharge date: 11/15/2017  Admission [place] presentation  Discharge Diagnoses: successful external cephalic version Active Problems:   Breech presentation   Discharged Condition: good  Hospital Course: Teresa Marsh is a 37 y.o. female presenting for [id number] [redacted]w[redacted]d Breech presentation for ECV.         OB History    Gravida  4   Para  2   Term  2   Preterm      AB  1   Living  2     SAB  1   TAB      Ectopic      Multiple      Live Births  2          History reviewed. No pertinent past medical history. History reviewed. No pertinent surgical history. Family History: family history is not on file. Social History:  reports that she has never smoked. She has never used smokeless tobacco. She reports that she does not drink alcohol or use drugs.     Maternal Diabetes: No Genetic Screening: Declined Maternal Ultrasounds/Referrals: Normal Fetal Ultrasounds or other Referrals:  None Maternal Substance Abuse:  No Significant Maternal Medications:  None Significant Maternal Lab Results:  None Other Comments:  breech  ROS Maternal Medical History:  Reason for admission: Breech presentation  Fetal activity: Perceived fetal activity is normal.   Last perceived fetal movement was within the past hour.    Prenatal complications: no prenatal complications Prenatal Complications - Diabetes: none.    Blood pressure 106/68, pulse (!) 101, last menstrual period 02/24/2017, unknown if currently breastfeeding. Maternal Exam:  Abdomen: Patient reports no abdominal tenderness. Fetal presentation: breech US  Shows breech presentation with normal AF  Introitus: not evaluated.     Physical Exam  Vitals reviewed. Constitutional: She is oriented to person, place, and time. She appears well-developed. No distress.   Cardiovascular: Normal rate.  Respiratory: Effort normal.  Neurological: She is alert and oriented to person, place, and time.  Skin: Skin is warm and dry.  Psychiatric: She has a normal mood and affect. Her behavior is normal.    Prenatal labs: ABO, Rh: O/Positive/-- (03/06 1013) Antibody: Negative (03/06 1013) Rubella: 21.30 (03/06 1013) RPR: Non Reactive (07/03 0821)  HBsAg: Negative (03/06 1013)  HIV: Non Reactive (07/03 0821)  GBS:     Assessment/Plan: Breech presentation for ECV, the risk of emergency delivery, labor or ROM, failure, discomfort, hospital admission were discussed with interpreter present and questions were answered and she signed consent              Successful ECV under US guidance  Consults: None  Significant Diagnostic Studies: bedside US, fetal monitoring  Treatments: ECV  Discharge Exam: Blood pressure 115/68, pulse (!) 108, temperature 97.8 F (36.6 C), temperature source Oral, resp. rate 20, last menstrual period 02/24/2017, unknown if currently breastfeeding. General appearance: alert, cooperative and no distress Extremities: extremities normal, atraumatic, no cyanosis or edema  Disposition: Discharge disposition: 01-Home or Self Care        Allergies as of 11/15/2017   No Known Allergies     Medication List    TAKE these medications   CONCEPT OB 130-92.4-1 MG Caps Take 1 tablet by mouth daily.      Follow-up [place] for [place] Follow up in 1 week(s).   Specialty:  Obstetrics and Gynecology  Contact information: [place] [phone]          Signed: Emeterio Reeve 11/15/2017, 9:57 AM

## 2017-11-15 NOTE — Discharge Instructions (Signed)
Sinh n? qua ???ng m ??o Vaginal Delivery Sinh n? qua ???ng m ??o c ngh?a l qu v? sinh con b?ng cch ??y em b ra kh?i ?ng ?? (m ??o). ??i ng? nhn vin y t? s? tr? gip qu v? tr??c, trong v sau khi sinh n? qua ???ng m ??o. Tr?i nghi?m sinh con l duy nh?t ??i v?i m?i ng??i ph? n? v m?i l?n mang thai v tr?i nghi?m sinh con s? thay ??i ty thu?c vo n?i m qu v? ch?n ?? sinh n?. Ti c?n lm g ?? chu?n b? cho s? ra ??i c?a em b? Tr??c khi em b c?a qu v? ???c sinh ra, ?i?u quan tr?ng l qu v? trao ??i v?i chuyn gia ch?m [place] s?c kh?e v?:  Cc ?u tin chuy?n d? v sinh n? c?a qu v?. Nh?ng y?u t? ny c th? bao g?m: ? Cc thu?c m qu v? c th? ???c cho dng. ? Qu v? s? qu?n l c?n ?au nh? th? no. ?i?u ny c th? bao g?m cc k? thu?t gi?m ?au khng dng thu?c ho?c gi?m ?au b?ng thu?c tim ch?ng h?n nh? thu?c gy t ngoi mng c?ng. ? Qu v? v em b s? ???c theo di nh? th? no trong khi chuy?n d? v sinh n?. ? Ai c th? ? trong phng sinh v?i qu v?. ? Cc c?m gic c?a qu v? v? vi?c sinh m? (m? l?y thai, hay m? b?t con) n?u vi?c ny tr? nn c?n thi?t. ? Cc c?m gic c?a qu v? v? vi?c nh?n mu hi?n thng qua ???ng truy?n t?nh m?ch (truy?n mu) n?u vi?c ny tr? nn c?n thi?t.  Qu v? c th? lm nh?ng vi?c ny hay khng: ? Ch?p hnh ho?c quay video lc sinh. ? ?n trong khi chuy?n d? v sinh. ? Di chuy?n xung quanh, ?i l?i ho?c thay ??i t? th? trong khi chuy?n d? v sinh n?.  C?n d? ki?n ?i?u g sau khi em b ???c sinh ra, ch?ng h?n nh?: ? C tr hon k?p v c?t dy r?n hay khng. ? Ai s? ch?m [place] cho em b ngay sau khi sinh ra. ? Cc thu?c ho?c xt nghi?m c th? ???c khuy?n ngh? cho em b. ? B?nh vi?n ho?c trung tm h? sinh c h? tr? cho em b b hay khng. ? Qu v? s? n?m t?i b?nh vi?n ho?c trung tm h? sinh trong bao lu.  B?t k? tnh tr?ng b?nh l no m qu v? c s? c th? ?nh h??ng ??n em b ho?c tr?i nghi?m chuy?n d? v sinh n? c?a qu v? nh? th? no.  ?? chu?n b? cho  s? ra ??i c?a em b, qu v? c?ng c?n:  ??n t?t c? cc bu?i khm ch?m [place] s?c kh?e tr??c khi sinh n? (khm tr??c khi sinh) theo khuy?n ngh? c?a chuyn gia ch?m  s?c kh?e. ?i?u ny c vai tr quan tr?ng.  Chu?n b? nh c?a cho s? xu?t hi?n c?a em b. ??m b?o qu v? c: ? T lt. ? Qu?n o em b. ? D?ng c? cho b. ? B? tr ch? ng? an ton cho qu v? v em b.  L?p gh? ng?i  t cho tr? em trong xe c?a qu v?. Nh? m?t ng??i l?p ??t gh? ng?i  t cho tr? em c ch?ng nh?n ki?m tra gh? ng?i  t cho tr? em c?a qu v? ?? ??m b?o n ? ???c l?p ??t an ton.  Ngh?  v? vi?c ai s? gip qu v? v em b ? nh trong t nh?t vi tu?n ??u tin sau khi sinh.  Ti c th? d? ki?n ?i?u g khi v? ??n trung tm h? sinh ho?c b?nh vi?n? Khi qu v? ?ang chuy?n d? v vo b?nh vi?n ho?c trung tm h? sinh, chuyn gia ch?m [place] s?c kh?e c th?:  Xem xt ti?n s? mang [place] c?a qu v? v b?t k? lo ng?i no qu v? c.  ??t m?t ???ng truy?n t?nh m?ch vo m?t trong cc t?nh m?ch c?a qu v?. ???ng truy?n ny ???c dng ?? cung c?p d?ch v thu?c cho qu v?.  Ki?m tra huy?t p, m?ch, nhi?t ?? v nh?p tim (cc d?u hi?u sinh t?n) c?a qu v?.  Ki?m tra xem ti n??c ?i (ti ?i) c?a qu v? ? v? (rch) ch?a.  Trao ??i v?i qu v? v? k? ho?ch sinh n? v th?o lu?n cc ph??ng n ki?m sot ?au.  Theo di [place] gia ch?m [employer] s?c kh?e c th? theo di cc c?n co bp (theo di t? cung) c?a qu v? v nh?p tim c?a em b (theo di [place] nhi). Qu v? c th? c?n ???c theo di:  Th??ng xuyn, nh?ng khng lin t?c (cch qung).  Ton b? th?i gian hay trong th?i gian di (lin t?c). Theo di lin t?c c th? c?n thi?t n?u: ? Qu v? ?ang dng m?t s? thu?c nh?t ??nh, ch?ng h?n nh? thu?c gi?m ?au ho?c thu?c lm cho t? cung co bp m?nh h?n. ? Qu v? c bi?n ch?ng [place] k? ho?c chuy?n d?.  Theo di c th? ???c th?c hi?n b?ng cch:  ??t m?t ?ng nghe ??c bi?t ho?c thi?t b? theo di c?m tay ln b?ng c?a qu v? ?? ki?m tra nh?p tim c?a em b v c?m  nh?n cc c?n co bp ? b?ng qu v?. Ph??ng php theo di ny khng lin t?c ghi l?i nh?p tim em b ho?c cc c?n co bp c?a qu v?.  ??t cc my theo di ln b?ng qu v? (my theo di bn ngoi) ?? ghi l?i nh?p tim c?a em b v t?n su?t v th?i gian ko di c?a cc c?n co bp. Qu v? c th? khng ph?i lc no c?ng mang my theo di bn ngoi.  ??t cc my theo di v trong t? cung qu v? (my theo di bn trong) ?? ghi l?i nh?p tim c?a em b v t?n su?t v th?i gian ko di v ?? m?nh c?a cc c?n co bp. ? Chuyn gia ch?m [place] s?c kh?e c th? s? d?ng cc my theo di bn trong n?u v? ? c?n thm thng tin v? ?? m?nh c?a cc c?n co bp c?a qu v? ho?c nh?p tim c?a em b. ? Cc my theo di bn trong ???c ??t b?ng cch lu?n m?t dy linh ho?t m?nh qua m ??o v vo trong t? cung c?a qu v?. Ty thu?c vo lo?i my theo di, n c th? n?m trong t? cung ho?c ??u em b cho ??n khi sinh. ? Chuyn gia ch?m [place] s?c kh?e s? th?o lu?n v? nh?ng l?i ch v r?i ro c?a vi?c s? d?ng m?t my theo di bn trong v h?i xem qu v? c ??ng  khng tr??c khi ??a my theo di vo trong.  ?o t? xa. ?y l ki?u theo di lin t?c c th? ???c th?c hi?n b?ng cc my theo di bn ngoi ho?c bn trong.  Thay v ph?i n?m trn gi??ng, qu v? c th? di chuy?n xung quan trong khi ?o t? xa. Hy h?i chuyn gia ch?m [place] s?c kh?e xem qu v? c th? l?a ch?n ?o t? xa khng.  Khm th?c th? Chuyn gia ch?m [place] s?c kh?e c th? khm th?c th? cho qu v?. Vi?c ny c th? bao g?m:  Ki?m tra xem em b c n?m ? t? th?: ? ??u h??ng v? pha m ??o c?a qu v? (??u quay xu?ng). ?y l t? th? th??ng g?p nh?t. ? ??u h??ng v? pha ??nh t? cung c?a qu v? (??u quay ln hay ngi mng). N?u em b ? t? th? ngi mng, chuyn gia ch?m [place] s?c kh?e c th? th? [place] em b sang t? th? ??u quay xu?ng ?? qu v? c th? sinh qua ???ng m ??o. N?u khng c v? r?ng em b c th? ???c sinh qua ???ng m ??o, chuyn gia ch?m [place] s?c kh?e c th? khuy?n ngh? ph?u thu?t ?? sinh b.  Trong cc tr??ng h?p hi?m g?p, qu v? c th? sinh qua ???ng m ??o n?u em b ? t? th? ??u quay ln (sinh ngi mng). ? N?m ngang b?ng (ngang). Cc b n?m ngang b?ng khng th? sinh qua ???ng m ??o.  Ki?m tra c? t? cung c?a qu v? ?? xc ??nh: ? Xem c? t? cung c ?ang m?ng h?n (m? ?i) hay khng. ? Xem c? t? cung c ?ang m? ra (gin ra) hay khng. ? Em b ? di chuy?n su ??n m?c no vo ?ng ??.  Ba giai ?o?n chuy?n d? v sinh n? l g?  Chuy?n d? v sinh n? bnh th??ng ???c chia thnh ba giai ?o?n sau ?y: [place] ?o?n 1  Giai ?o?n 1 l giai ?o?n di nh?t c?a qu trnh chuy?n d? v n c th? ko di nhi?u gi? hay ngy. Giai ?o?n 1 bao g?m: ? Chuy?n d? s?m. ?y l khi cc c?n co bp c th? khng ??u ho?c ??u v nh?[place] th??ng, cc c?n co bp chuy?n d? s?m th??ng cch nhau h?n 10 pht. ? Chuy?n d? tch c?c. ?y l khi cc c?n co bp tr? nn lu h?n, ??u h?n, th??ng xuyn h?n v m?nh h?n. ? Giai ?o?n chuy?n ti?p. ?y l khi cc c?n co bp x?y ra r?t g?n nhau, r?t m?nh v c th? ko di lu h?n trong cc giai ?o?n chuy?n d? khc.  Cc c?n co bp th??ng c?m th?y nh?, khng th??ng xuyn v khng ??u vo lc ??u. Cc c?n co tr? nn m?nh h?n, th??ng xuyn h?n (kho?ng m?i 2-3 pht m?t l?n) v ??u h?n khi qu v? chuy?n t? giai ?o?n chuy?n d? s?m sang giai ?o?n chuy?n d? tch c?c v chuy?n ti?p.  Nhi?u ph? n? tr?i qua giai ?o?n 1 m?t cch t? nhin, nh?ng qu v? c th? c?n tr? gip ?? ti?p t?c qu trnh. N?u ?i?u ny x?y ra, chuyn gia ch?m [place] s?c kh?e c th? trao ??i v?i qu v? v? vi?c: ? R?ch ti ?i n?u ti ?i ch?a v?. ? Cho qu v? dng thu?c ?? lm cho cc c?n co bp m?nh h?n v th??ng xuyn h?n.  Giai ?o?n 1 k?t thc khi c? t? cung gin ra hon ton ??n 4 inch (10 cm) v hon ton m? ra. ?i?u ny x?y ra ? cu?i giai ?o?n chuy?n ti?p. Giai ?o?n 2  Khi c? t? cung gin v m? hon ton ??  n 4 inch (10 cm), qu v? c th? b?t ??u c c?m gic mu?n ??y ra. Thng th??ng c? th? s? t? nhin ngh? ng?i tr??c khi  c?m th?y mu?n ??y ra, ??c bi?t l n?u qu v? ? nh?n thu?c gy t ngoi mng c?ng ho?c m?t s? thu?c gi?m ?au khc. Th?i gian ngh? ng?i ny c th? ko di ??n 1-2 gi?, ty thu?c vo tr?i nghi?m chuy?n d? duy nh?t c?a qu v?.  Trong giai ?o?n 2, cc c?n co bp th??ng t ?au h?n, b?i v vi?c ??y ra gip gi?m ?au do co bp. Thay v ?au do co bp, qu v? c th? c?m th?y c?ng t?c v ?au rt, ??c bi?t l khi ph?n r?ng nh?t c?a ??u em b ?i qua c?a m ??o (c? m ??o).  Chuyn gia ch?m [place] s?c kh?e s? theo di ch?t ch? qu trnh ??y ra c?a qu v? v qu trnh em b ?i ra thng qua m ??o trong giai ?o?n 2.  Chuyn gia ch?m [place] s?c kh?e c th? [place] bp khu v?c da gi?a c?a m ??o v h?u mn (vng ?y ch?u) ho?c ?n nh? vo vng ?y ch?u c?a qu v?. Vi?c ny gip ?y ch?u gin ra khi ??u em b b?t ??u ra ??n c? m ??o, c th? gip ng?n tnh tr?ng rch vng ?y ch?u. ? Trong m?t s? tr??ng h?p, m?t v?t m? c th? ???c r?ch ? vng ?y ch?u (th? thu?t c?t t?ng sinh mn) ?? cho php em b chui ra qua l? m ??o. Th? thu?t c?t t?ng sinh mn gip lm cho l? m ??o r?ng h?n nh?m t?o thm ch? cho em b chui qua.  Vi?c r?t quan tr?ng l th? v t?p trung ?? chuyn gia ch?m [place] s?c kh?e c th? ki?m sot qu trnh ?i ra c?a ??u em b. Chuyn gia ch?m [place] s?c kh?e c th? cho qu v? gi?m c??ng ?? ??y, nh?m gip ng?n ng?a rch vng ?y ch?u.  Sau khi ??u em b ra ngoi, hai vai v ph?n cn l?i c?a c? th? th??ng s? ?i ra r?t nhanh m khng c kh kh?n g.  Khi em b ???c sinh ra, dy r?n c th? ???c c?t ngay l?p t?c ho?c c th? tr hon 1-2 pht, ty thu?c vo s?c kh?e c?a b. ?i?u ny c th? khc nhau gi?a cc chuyn gia ch?m [place] s?c kh?e, b?nh vi?n v trung tm h? sinh.  N?u qu v? v em b ??u ?? kh?e, b c th? ???c ??t ln ng?c ho?c b?ng qu v? ?? duy tr thn nhi?t c?a b v gip hai ng??i k?t n?i v?i nhau. M?t s? b m? v em b b?t ??u cho b vo lc ny. Nhm ch?m [place] s?c kh?e c?a qu v? s? lau kh em b v gip gi? ?m cho b  trong su?t th?i gian ny.  Em b c th? c?n ???c ch?m [place] ngay n?u b: ? C d?u hi?u ki?t s?c trong qu trnh chuy?n d?. ? C m?t tnh tr?ng b?nh l. ? ???c sinh ra qu s?m (sinh non). ? ? ??i ti?n tr??c khi sinh (phn su). ? C cc d?u hi?u kh chuy?n t? vi?c n?m trong t? cung sang n?m ngoi t? cung. N?u qu v? d? ??nh cho con b, nhm ch?m [place] s?c kh?e s? gip qu v? b?t ??u cho b b. Giai ?o?n 3  Giai ?o?n ba c?a qu trnh chuy?n d? b?t ??u  ngay sau khi qu v? sinh em b v k?t thc sau khi qu v? ??y nhau [place] ra. Nhau thai l m?t c? quan pht tri?n trong su?t [place] k? ?? cung c?p  xi v ch?t dinh d??ng cho em b trong t? cung.  ??y nhau [place] ra c th? c?n ph?i ??y v qu v? c th? c cc c?n co bp nh?[place] con b c th? kch thch cc c? co bp nh?m gip qu v? ??y nhau [place] ra.  Sau khi nhau thai ???c ??y ra, t? cung ph?i bp l?i (co l?i) v tr? nn r?n ch?c. ?i?u ny gip ng?ng ch?y mu trong t? cung. ?? gip t? cung co l?i v ki?m sot ch?y mu, chuyn gia ch?m [place] s?c kh?e c th?: ? Cho qu v? dng thu?c b?ng cch tim, truy?n qua ???ng truy?n t?nh m?ch, u?ng ho?c ??t ? tr?c trng (qua tr?c trng). ? [place] bp b?ng c?a qu v? ho?c ti?n hnh khm m ??o ?? lo?i b? b?t k? c?c mu ?ng no cn l?i trong t? cung c?a qu v?. ? Rt h?t n??c ti?u trong bng quang c?a qu v? b?ng cch ??t m?t ?ng m?m, m?nh (?ng thng) vo bng quang c?a qu v?. ? Khuy?n khch qu v? cho con b. Sau khi sinh xong, qu v? v em b s? ???c theo di ch?t ch? ?? ??m b?o r?ng c? hai ??u kh?e m?nh cho ??n khi s?n sng v? nh. Nhm ch?m [place] s?c kh?e s? h??ng d?n qu v? cch ch?m [place] b?n thn v em b. Thng tin ny khng nh?m m?c ?ch thay th? cho l?i khuyn m chuyn gia ch?m [place] s?c kh?e ni v?i qu v?. Hy b?o ??m qu v? ph?i th?o lu?n b?t k? v?n ?? g m qu v? c v?i chuyn gia ch?m [place] s?c kh?e c?a qu v?. Document Released: 03/18/2015 Document Revised: 06/08/2016 Document Reviewed: 03/06/2015 Elsevier  Interactive Patient Education  2018 [place] Birth What is a breech birth? A breech birth is when a baby is born with the buttocks or the feet first. Most babies are in a head down (vertex) position when they are born. There are three types of breech babies:  When the babys buttocks are showing first in the birth canal (vagina) with the legs straight up and the feet at the babys head (frank breech).  When the babys buttocks shows first with the legs bent at the knees and the feet down near the buttocks (complete breech).  When one or both of the babys feet are down below the buttocks (footling breech).  What are the risks of a breech birth? Having a breech birth increases the risk to your baby. A breech birth may cause the following:  Umbilical cord prolapse. This is when the umbilical cord is in front of the baby before or during labor. This can cause the cord to become pinched or compressed. This can reduce the flow of blood and oxygen to the baby.  The baby getting stuck in the birth canal, which can cause injury or, rarely, death.  Injury to the nerves in the shoulder, arm, and hand (brachial plexus injury) when delivered.  Your baby being born too early (prematurely).  An increased need for a cesarean delivery.  What increases the risk of having a breech baby? It is not known what causes your baby to be breech. However, risk factors that may increase your chances of having a breech baby include the  following:  The mother having had several babies already.  The mother having twins or more.  The mother having a baby with certain congenital disabilities.  The mother going into labor early.  The mother having problems with her uterus, such as a tumor.  The mother having placenta problems (placenta previa) or too much or not enough fluid surrounding the baby (amniotic fluid).  How do I know if my baby is breech? There are no symptoms for you to know that your  baby is breech. When you are close to your due date, your health care provider can tell if your baby is breech by:  An abdominal or vaginal (pelvic) exam.  An ultrasound.  Your health care provider may also be able to tell that your baby is breech if your babys heartbeat is heard above your belly button. What can be done if my baby is breech?  Your health care provider may try to turn the baby in your uterus. This is a procedure called external cephalic version (ECV). This is done by your health care provider. He or she will place both hands on your abdomen and gently and slowly turn the baby around. It is important to know that ECV can increase your chances of suddenly going into labor. If an ECV is done, it is done toward the end of a healthy pregnancy. The baby may remain in this position or he or she may turn back to the breech position. You and your health care provider will discuss if an ECV is recommended for you and your baby. How will I delivery my baby if my baby is breech? You and your health care provider will discuss the best way to deliver your baby. If your baby is breech, it is less likely that a vaginal delivery will be recommended due to the risks. Some breech babies may be delivered safely without a cesarean, while in other cases health care providers will recommend a cesarean delivery. This information is not intended to replace advice given to you by your health care provider. Make sure you discuss any questions you have with your health care provider. Document Released: 04/12/2006 Document Revised: 02/06/2016 Document Reviewed: 12/24/2013 Elsevier Interactive Patient Education  2017 [employer].

## 2017-11-21 ENCOUNTER — Ambulatory Visit (INDEPENDENT_AMBULATORY_CARE_PROVIDER_SITE_OTHER): Payer: Medicaid Other | Admitting: Medical

## 2017-11-21 VITALS — BP 113/67 | HR 100 | Wt 165.0 lb

## 2017-11-21 DIAGNOSIS — Z3480 Encounter for supervision of other normal pregnancy, unspecified trimester: Secondary | ICD-10-CM

## 2017-11-21 DIAGNOSIS — O09523 Supervision of elderly multigravida, third trimester: Secondary | ICD-10-CM

## 2017-11-21 DIAGNOSIS — O321XX0 Maternal care for breech presentation, not applicable or unspecified: Secondary | ICD-10-CM

## 2017-11-21 NOTE — Patient Instructions (Signed)
Research childbirth classes and hospital preregistration at [email or web address]  Fetal Movement Counts Patient Name: ________________________________________________ Patient Due Date: ____________________ What is a fetal movement count? A fetal movement count is the number of times that you feel your baby move during a certain amount of time. This may also be called a fetal kick count. A fetal movement count is recommended for every pregnant woman. You may be asked to start counting fetal movements as early as week 28 of your pregnancy. Pay attention to when your baby is most active. You may notice your baby's sleep and wake cycles. You may also notice things that make your baby move more. You should do a fetal movement count:  When your baby is normally most active.  At the same time each day.  A good time to count movements is while you are resting, after having something to eat and drink. How do I count fetal movements? 1. Find a quiet, comfortable area. Sit, or lie down on your side. 2. Write down the date, the start time and stop time, and the number of movements that you felt between those two times. Take this information with you to your health care visits. 3. For 2 hours, count kicks, flutters, swishes, rolls, and jabs. You should feel at least 10 movements during 2 hours. 4. You may stop counting after you have felt 10 movements. 5. If you do not feel 10 movements in 2 hours, have something to eat and drink. Then, keep resting and counting for 1 hour. If you feel at least 4 movements during that hour, you may stop counting. Contact a health care provider if:  You feel fewer than 4 movements in 2 hours.  Your baby is not moving like he or she usually does. Date: ____________ Start time: ____________ Stop time: ____________ Movements: ____________ Date: ____________ Start time: ____________ Stop time: ____________ Movements: ____________ Date: ____________ Start time: ____________  Stop time: ____________ Movements: ____________ Date: ____________ Start time: ____________ Stop time: ____________ Movements: ____________ Date: ____________ Start time: ____________ Stop time: ____________ Movements: ____________ Date: ____________ Start time: ____________ Stop time: ____________ Movements: ____________ Date: ____________ Start time: ____________ Stop time: ____________ Movements: ____________ Date: ____________ Start time: ____________ Stop time: ____________ Movements: ____________ Date: ____________ Start time: ____________ Stop time: ____________ Movements: ____________ This information is not intended to replace advice given to you by your health care provider. Make sure you discuss any questions you have with your health care provider. Document Released: 03/21/2006 Document Revised: 10/19/2015 Document Reviewed: 03/31/2015 Elsevier Interactive Patient Education  2018 [employer] Inc[employer]  Braxton Hicks Contractions Contractions of the uterus can occur throughout pregnancy, but they are not always a sign that you are in labor. You may have practice contractions called Braxton Hicks contractions. These false labor contractions are sometimes confused with true labor. What are Braxton Hicks contractions? Braxton Hicks contractions are tightening movements that occur in the muscles of the uterus before labor. Unlike true labor contractions, these contractions do not result in opening (dilation) and thinning of the cervix. Toward the end of pregnancy (32-34 weeks), Braxton Hicks contractions can happen more often and may become stronger. These contractions are sometimes difficult to tell apart from true labor because they can be very uncomfortable. You should not feel embarrassed if you go to the hospital with false labor. Sometimes, the only way to tell if you are in true labor is for your health care provider to look for changes in the cervix. The health care provider will   do a physical  exam and may monitor your contractions. If you are not in true labor, the exam should show that your cervix is not dilating and your water has not broken. If there are other health problems associated with your pregnancy, it is completely safe for you to be sent home with false labor. You may continue to have Braxton Hicks contractions until you go into true labor. How to tell the difference between true labor and false labor True labor  Contractions last 30-70 seconds.  Contractions become very regular.  Discomfort is usually felt in the top of the uterus, and it spreads to the lower abdomen and low back.  Contractions do not go away with walking.  Contractions usually become more intense and increase in frequency.  The cervix dilates and gets thinner. False labor  Contractions are usually shorter and not as strong as true labor contractions.  Contractions are usually irregular.  Contractions are often felt in the front of the lower abdomen and in the groin.  Contractions may go away when you walk around or change positions while lying down.  Contractions get weaker and are shorter-lasting as time goes on.  The cervix usually does not dilate or become thin. Follow these instructions at home:  Take over-the-counter and prescription medicines only as told by your health care provider.  Keep up with your usual exercises and follow other instructions from your health care provider.  Eat and drink lightly if you think you are going into labor.  If Braxton Hicks contractions are making you uncomfortable: ? Change your position from lying down or resting to walking, or change from walking to resting. ? Sit and rest in a tub of warm water. ? Drink enough fluid to keep your urine pale yellow. Dehydration may cause these contractions. ? Do slow and deep breathing several times an hour.  Keep all follow-up prenatal visits as told by your health care provider. This is  important. Contact a health care provider if:  You have a fever.  You have continuous pain in your abdomen. Get help right away if:  Your contractions become stronger, more regular, and closer together.  You have fluid leaking or gushing from your vagina.  You pass blood-tinged mucus (bloody show).  You have bleeding from your vagina.  You have low back pain that you never had before.  You feel your baby's head pushing down and causing pelvic pressure.  Your baby is not moving inside you as much as it used to. Summary  Contractions that occur before labor are called Braxton Hicks contractions, false labor, or practice contractions.  Braxton Hicks contractions are usually shorter, weaker, farther apart, and less regular than true labor contractions. True labor contractions usually become progressively stronger and regular and they become more frequent.  Manage discomfort from Braxton Hicks contractions by changing position, resting in a warm bath, drinking plenty of water, or practicing deep breathing. This information is not intended to replace advice given to you by your health care provider. Make sure you discuss any questions you have with your health care provider. Document Released: 07/05/2016 Document Revised: 07/05/2016 Document Reviewed: 07/05/2016 Elsevier Interactive Patient Education  2018 Elsevier Inc.    

## 2017-11-21 NOTE — Progress Notes (Signed)
   PRENATAL VISIT NOTE  Subjective:  Teresa Marsh is a 37 y.o. [phone] at [redacted]w[redacted]d being seen today for ongoing prenatal care.  She is currently monitored for the following issues for this low-risk pregnancy and has Supervision of other normal pregnancy, antepartum; AMA (advanced maternal age) multigravida 35+; Uterine size date discrepancy pregnancy, third trimester; Language barrier; Uterine fibroids affecting pregnancy; and Breech presentation on their problem list.  Patient reports occasional contractions.  Contractions: Irregular. Vag. Bleeding: None.  Movement: Present. Denies leaking of fluid.   The following portions of the patient's history were reviewed and updated as appropriate: allergies, current medications, past family history, past medical history, past social history, past surgical history and problem list. Problem list updated.  Objective:   Vitals:   11/21/17 0835  BP: 113/67  Pulse: 100  Weight: 165 lb (74.8 kg)    Fetal Status: Fetal Heart Rate (bpm): 154 Fundal Height: 39 cm Movement: Present  Presentation: Vertex  General:  Alert, oriented and cooperative. Patient is in no acute distress.  Skin: Skin is warm and dry. No rash noted.   Cardiovascular: Normal heart rate noted  Respiratory: Normal respiratory effort, no problems with respiration noted  Abdomen: Soft, gravid, appropriate for gestational age.  Pain/Pressure: Present     Pelvic: Cervical exam performed Dilation: Fingertip Effacement (%): Thick Station: -3  Extremities: Normal range of motion.  Edema: Trace  Mental Status: Normal mood and affect. Normal behavior. Normal judgment and thought content.   Assessment and Plan:  Pregnancy: [id number] at [redacted]w[redacted]d  1. Supervision of other normal pregnancy, antepartum - Doing well, occasional contractions  2. Multigravida of advanced maternal age in third trimester - < 36 years old. No change in management   3. Breech presentation, single or unspecified  fetus - Successful version 11/15/17  Term labor symptoms and general obstetric precautions including but not limited to vaginal bleeding, contractions, leaking of fluid and fetal movement were reviewed in detail with the patient. Please refer to After Visit Summary for other counseling recommendations.  Return in about 1 week (around 11/28/2017) for LOB.  Kerry Hough, PA-C

## 2017-11-29 ENCOUNTER — Ambulatory Visit (INDEPENDENT_AMBULATORY_CARE_PROVIDER_SITE_OTHER): Payer: Medicaid Other

## 2017-11-29 VITALS — BP 117/78 | HR 98 | Wt 165.2 lb

## 2017-11-29 DIAGNOSIS — Z3480 Encounter for supervision of other normal pregnancy, unspecified trimester: Secondary | ICD-10-CM

## 2017-11-29 NOTE — Progress Notes (Signed)
   PRENATAL VISIT NOTE  Subjective:  Teresa Marsh is a 37 y.o. [phone] at [redacted]w[redacted]d being seen today for ongoing prenatal care.  She is currently monitored for the following issues for this low-risk pregnancy and has Supervision of other normal pregnancy, antepartum; AMA (advanced maternal age) multigravida 35+; Uterine size date discrepancy pregnancy, third trimester; Language barrier; Uterine fibroids affecting pregnancy; and Breech presentation on their problem list.  Patient reports no complaints.  Contractions: Irritability. Vag. Bleeding: None.  Movement: Present. Denies leaking of fluid.   The following portions of the patient's history were reviewed and updated as appropriate: allergies, current medications, past family history, past medical history, past social history, past surgical history and problem list. Problem list updated.  Objective:   Vitals:   11/29/17 1540  BP: 117/78  Pulse: 98  Weight: 165 lb 3.2 oz (74.9 kg)    Fetal Status: Fetal Heart Rate (bpm): 157 Fundal Height: 39 cm Movement: Present     General:  Alert, oriented and cooperative. Patient is in no acute distress.  Skin: Skin is warm and dry. No rash noted.   Cardiovascular: Normal heart rate noted  Respiratory: Normal respiratory effort, no problems with respiration noted  Abdomen: Soft, gravid, appropriate for gestational age.  Pain/Pressure: Present     Pelvic: Cervical exam deferred        Extremities: Normal range of motion.  Edema: Trace  Mental Status: Normal mood and affect. Normal behavior. Normal judgment and thought content.   Assessment and Plan:  Pregnancy: [id number] at [redacted]w[redacted]d  1. Supervision of other normal pregnancy, antepartum - No complaints. Routine care - Discussed normal sleep and wake cycles of fetal movement at this gestation - IOL scheduled for 10/6 at 0700am  Term labor symptoms and general obstetric precautions including but not limited to vaginal bleeding, contractions, leaking  of fluid and fetal movement were reviewed in detail with the patient. Please refer to After Visit Summary for other counseling recommendations.  Return in about 1 week (around 12/06/2017) for Return OB visit and NST.  Future Appointments  Date Time Provider [place]  12/06/2017  9:15 AM Jorje Guild, NP [facility] [place]  12/08/2017  7:00 AM [facility]-BSSCHED ROOM [facility]-BSSCHED None    Wende Mott, [place]  11/29/17 3:55 PM

## 2017-11-29 NOTE — Patient Instructions (Signed)

## 2017-12-03 ENCOUNTER — Encounter (HOSPITAL_COMMUNITY): Payer: Self-pay | Admitting: *Deleted

## 2017-12-03 ENCOUNTER — Telehealth (HOSPITAL_COMMUNITY): Payer: Self-pay | Admitting: *Deleted

## 2017-12-03 NOTE — Telephone Encounter (Signed)
Preadmission screen [phone] interpreter number

## 2017-12-06 ENCOUNTER — Ambulatory Visit (INDEPENDENT_AMBULATORY_CARE_PROVIDER_SITE_OTHER): Payer: Medicaid Other | Admitting: *Deleted

## 2017-12-06 ENCOUNTER — Ambulatory Visit (INDEPENDENT_AMBULATORY_CARE_PROVIDER_SITE_OTHER): Payer: Medicaid Other | Admitting: Student

## 2017-12-06 ENCOUNTER — Ambulatory Visit: Payer: Self-pay

## 2017-12-06 VITALS — BP 114/73 | HR 100 | Wt 164.0 lb

## 2017-12-06 DIAGNOSIS — O480 Post-term pregnancy: Secondary | ICD-10-CM

## 2017-12-06 DIAGNOSIS — Z3483 Encounter for supervision of other normal pregnancy, third trimester: Secondary | ICD-10-CM

## 2017-12-06 DIAGNOSIS — Z789 Other specified health status: Secondary | ICD-10-CM

## 2017-12-06 DIAGNOSIS — Z3480 Encounter for supervision of other normal pregnancy, unspecified trimester: Secondary | ICD-10-CM

## 2017-12-06 NOTE — Progress Notes (Signed)
   PRENATAL VISIT NOTE  Subjective:  Teresa Marsh is a 37 y.o. [phone] at [redacted]w[redacted]d being seen today for ongoing prenatal care.  She is currently monitored for the following issues for this low-risk pregnancy and has Supervision of other normal pregnancy, antepartum; AMA (advanced maternal age) multigravida 35+; Uterine size date discrepancy pregnancy, third trimester; Language barrier; Uterine fibroids affecting pregnancy; and Breech presentation on their problem list.  Patient reports occasional contractions.  Contractions: Irregular. Vag. Bleeding: None.  Movement: Present. Denies leaking of fluid.   The following portions of the patient's history were reviewed and updated as appropriate: allergies, current medications, past family history, past medical history, past social history, past surgical history and problem list. Problem list updated.  Objective:   Vitals:   12/06/17 0911  BP: 114/73  Pulse: 100  Weight: 164 lb (74.4 kg)    Fetal Status: Fetal Heart Rate (bpm): 147 Fundal Height: 39 cm Movement: Present  Presentation: Vertex  General:  Alert, oriented and cooperative. Patient is in no acute distress.  Skin: Skin is warm and dry. No rash noted.   Cardiovascular: Normal heart rate noted  Respiratory: Normal respiratory effort, no problems with respiration noted  Abdomen: Soft, gravid, appropriate for gestational age.  Pain/Pressure: Present     Pelvic: Cervical exam performed Dilation: 1 Effacement (%): 50 Station: -3  Extremities: Normal range of motion.  Edema: Trace  Mental Status: Normal mood and affect. Normal behavior. Normal judgment and thought content.   Assessment and Plan:  Pregnancy: [id number] at [redacted]w[redacted]d  1. Supervision of other normal pregnancy, antepartum -Scheduled for IOL on 10/6. Admit orders entered.  -Reactive NST today  2. Language barrier -[place] video interpreter   Term labor symptoms and general obstetric precautions including but not limited to  vaginal bleeding, contractions, leaking of fluid and fetal movement were reviewed in detail with the patient. Please refer to After Visit Summary for other counseling recommendations.  Return for IOL scheduled, next visit will be PP.  Future Appointments  Date Time Provider [place]  12/08/2017  7:00 AM WH-BSSCHED ROOM WH-BSSCHED None    Jorje Guild, NP

## 2017-12-06 NOTE — Progress Notes (Signed)
Interpreter "Emogene Morgan" present for encounter.  Pt informed that the ultrasound is considered a limited OB ultrasound and is not intended to be a complete ultrasound exam.  Patient also informed that the ultrasound is not being completed with the intent of assessing for fetal or placental anomalies or any pelvic abnormalities.  Explained that the purpose of today's ultrasound is to assess for presentation, BPP and amniotic fluid volume.  Patient acknowledges the purpose of the exam and the limitations of the study.

## 2017-12-06 NOTE — Patient Instructions (Signed)
Labor Induction Labor induction is when steps are taken to cause a pregnant woman to begin the labor process. Most women go into labor on their own between 37 weeks and 42 weeks of the pregnancy. When this does not happen or when there is a medical need, methods may be used to induce labor. Labor induction causes a pregnant woman's uterus to contract. It also causes the cervix to soften (ripen), open (dilate), and thin out (efface). Usually, labor is not induced before 39 weeks of the pregnancy unless there is a problem with the baby or mother. Before inducing labor, your health care provider will consider a number of factors, including the following:  The medical condition of you and the baby.  How many weeks along you are.  The status of the baby's lung maturity.  The condition of the cervix.  The position of the baby. What are the reasons for labor induction? Labor may be induced for the following reasons:  The health of the baby or mother is at risk.  The pregnancy is overdue by 1 week or more.  The water breaks but labor does not start on its own.  The mother has a health condition or serious illness, such as high blood pressure, infection, placental abruption, or diabetes.  The amniotic fluid amounts are low around the baby.  The baby is distressed. Convenience or wanting the baby to be born on a certain date is not a reason for inducing labor. What methods are used for labor induction? Several methods of labor induction may be used, such as:  Prostaglandin medicine. This medicine causes the cervix to dilate and ripen. The medicine will also start contractions. It can be taken by mouth or by inserting a suppository into the vagina.  Inserting a thin tube (catheter) with a balloon on the end into the vagina to dilate the cervix. Once inserted, the balloon is expanded with water, which causes the cervix to open.  Stripping the membranes. Your health care provider separates  amniotic sac tissue from the cervix, causing the cervix to be stretched and causing the release of a hormone called progesterone. This may cause the uterus to contract. It is often done during an office visit. You will be sent home to wait for the contractions to begin. You will then come in for an induction.  Breaking the water. Your health care provider makes a hole in the amniotic sac using a small instrument. Once the amniotic sac breaks, contractions should begin. This may still take hours to see an effect.  Medicine to trigger or strengthen contractions. This medicine is given through an IV access tube inserted into a vein in your arm. All of the methods of induction, besides stripping the membranes, will be done in the hospital. Induction is done in the hospital so that you and the baby can be carefully monitored. How long does it take for labor to be induced? Some inductions can take up to 2-3 days. Depending on the cervix, it usually takes less time. It takes longer when you are induced early in the pregnancy or if this is your first pregnancy. If a mother is still pregnant and the induction has been going on for 2-3 days, either the mother will be sent home or a cesarean delivery will be needed. What are the risks associated with labor induction? Some of the risks of induction include:  Changes in fetal heart rate, such as too high, too low, or erratic.  Fetal distress.    Chance of infection for the mother and baby.  Increased chance of having a cesarean delivery.  Breaking off (abruption) of the placenta from the uterus (rare).  Uterine rupture (very rare). When induction is needed for medical reasons, the benefits of induction may outweigh the risks. What are some reasons for not inducing labor? Labor induction should not be done if:  It is shown that your baby does not tolerate labor.  You have had previous surgeries on your uterus, such as a myomectomy or the removal of  fibroids.  Your placenta lies very low in the uterus and blocks the opening of the cervix (placenta previa).  Your baby is not in a head-down position.  The umbilical cord drops down into the birth canal in front of the baby. This could cut off the baby's blood and oxygen supply.  You have had a previous cesarean delivery.  There are unusual circumstances, such as the baby being extremely premature. This information is not intended to replace advice given to you by your health care provider. Make sure you discuss any questions you have with your health care provider. Document Released: 07/11/2006 Document Revised: 07/28/2015 Document Reviewed: 09/18/2012 Elsevier Interactive Patient Education  2017 Elsevier Inc.  

## 2017-12-07 ENCOUNTER — Inpatient Hospital Stay (HOSPITAL_COMMUNITY): Payer: Medicaid Other | Admitting: Anesthesiology

## 2017-12-07 ENCOUNTER — Inpatient Hospital Stay (HOSPITAL_COMMUNITY)
Admission: AD | Admit: 2017-12-07 | Discharge: 2017-12-09 | DRG: 807 | Disposition: A | Discharge status: Home / Self Care | Payer: Medicaid Other | Attending: Obstetrics & Gynecology | Admitting: Obstetrics & Gynecology

## 2017-12-07 ENCOUNTER — Encounter (HOSPITAL_COMMUNITY): Payer: Self-pay | Admitting: *Deleted

## 2017-12-07 ENCOUNTER — Other Ambulatory Visit: Payer: Self-pay

## 2017-12-07 DIAGNOSIS — D259 Leiomyoma of uterus, unspecified: Secondary | ICD-10-CM | POA: Diagnosis present

## 2017-12-07 DIAGNOSIS — Z3A40 40 weeks gestation of pregnancy: Secondary | ICD-10-CM

## 2017-12-07 DIAGNOSIS — O480 Post-term pregnancy: Secondary | ICD-10-CM

## 2017-12-07 DIAGNOSIS — O26843 Uterine size-date discrepancy, third trimester: Secondary | ICD-10-CM

## 2017-12-07 DIAGNOSIS — Z23 Encounter for immunization: Secondary | ICD-10-CM | POA: Diagnosis not present

## 2017-12-07 DIAGNOSIS — Z3490 Encounter for supervision of normal pregnancy, unspecified, unspecified trimester: Secondary | ICD-10-CM | POA: Diagnosis present

## 2017-12-07 DIAGNOSIS — Z3483 Encounter for supervision of other normal pregnancy, third trimester: Secondary | ICD-10-CM | POA: Diagnosis present

## 2017-12-07 DIAGNOSIS — O3413 Maternal care for benign tumor of corpus uteri, third trimester: Secondary | ICD-10-CM | POA: Diagnosis present

## 2017-12-07 HISTORY — DX: Other complications of anesthesia, initial encounter: T88.59XA

## 2017-12-07 HISTORY — DX: Dyspnea, unspecified: R06.00

## 2017-12-07 HISTORY — DX: Adverse effect of unspecified anesthetic, initial encounter: T41.45XA

## 2017-12-07 LAB — POCT FERN TEST: POCT Fern Test: POSITIVE

## 2017-12-07 LAB — CBC
HCT: 39 % (ref 36.0–46.0)
Hemoglobin: 13.5 g/dL (ref 12.0–15.0)
MCH: 33.2 pg (ref 26.0–34.0)
MCHC: 34.6 g/dL (ref 30.0–36.0)
MCV: 95.8 fL (ref 78.0–100.0)
Platelets: 247 10*3/uL (ref 150–400)
RBC: 4.07 MIL/uL (ref 3.87–5.11)
RDW: 13.9 % (ref 11.5–15.5)
WBC: 12.9 10*3/uL — ABNORMAL HIGH (ref 4.0–10.5)

## 2017-12-07 LAB — TYPE AND SCREEN
ABO/RH(D): O POS
ANTIBODY SCREEN: NEGATIVE

## 2017-12-07 LAB — ABO/RH: ABO/RH(D): O POS

## 2017-12-07 MED ORDER — TERBUTALINE SULFATE 1 MG/ML IJ SOLN
0.2500 mg | Freq: Once | INTRAMUSCULAR | Status: DC | PRN
  Filled 2017-12-07: qty 1

## 2017-12-07 MED ORDER — PRENATAL MULTIVITAMIN CH
1.0000 | ORAL_TABLET | Freq: Every day | ORAL | Status: DC
  Administered 2017-12-08: 1 via ORAL
  Filled 2017-12-07: qty 1

## 2017-12-07 MED ORDER — ZOLPIDEM TARTRATE 5 MG PO TABS: 5.0000 mg | ORAL_TABLET | Freq: Every evening | ORAL | Status: DC | PRN

## 2017-12-07 MED ORDER — SOD CITRATE-CITRIC ACID 500-334 MG/5ML PO SOLN: 30.0000 mL | ORAL | Status: DC | PRN

## 2017-12-07 MED ORDER — LIDOCAINE HCL (PF) 1 % IJ SOLN
30.0000 mL | INTRAMUSCULAR | Status: DC | PRN
  Filled 2017-12-07: qty 30

## 2017-12-07 MED ORDER — LIDOCAINE HCL (PF) 1 % IJ SOLN
INTRAMUSCULAR | Status: DC | PRN
  Administered 2017-12-07 (×2): 5 mL via EPIDURAL

## 2017-12-07 MED ORDER — LACTATED RINGERS IV SOLN
500.0000 mL | Freq: Once | INTRAVENOUS | Status: AC
  Administered 2017-12-07: 500 mL via INTRAVENOUS

## 2017-12-07 MED ORDER — EPHEDRINE 5 MG/ML INJ
10.0000 mg | INTRAVENOUS | Status: DC | PRN
  Filled 2017-12-07: qty 2

## 2017-12-07 MED ORDER — OXYCODONE-ACETAMINOPHEN 5-325 MG PO TABS: 1.0000 | ORAL_TABLET | ORAL | Status: DC | PRN

## 2017-12-07 MED ORDER — FLEET ENEMA 7-19 GM/118ML RE ENEM: 1.0000 | ENEMA | RECTAL | Status: DC | PRN

## 2017-12-07 MED ORDER — ACETAMINOPHEN 325 MG PO TABS
650.0000 mg | ORAL_TABLET | ORAL | Status: DC | PRN
  Administered 2017-12-08 – 2017-12-09 (×3): 650 mg via ORAL
  Filled 2017-12-07 (×3): qty 2

## 2017-12-07 MED ORDER — PHENYLEPHRINE 40 MCG/ML (10ML) SYRINGE FOR IV PUSH (FOR BLOOD PRESSURE SUPPORT)
80.0000 ug | PREFILLED_SYRINGE | INTRAVENOUS | Status: DC | PRN
  Filled 2017-12-07: qty 5

## 2017-12-07 MED ORDER — FENTANYL 2.5 MCG/ML BUPIVACAINE 1/10 % EPIDURAL INFUSION (WH - ANES)
14.0000 mL/h | INTRAMUSCULAR | Status: DC | PRN
  Administered 2017-12-07: 14 mL/h via EPIDURAL
  Filled 2017-12-07: qty 100

## 2017-12-07 MED ORDER — SENNOSIDES-DOCUSATE SODIUM 8.6-50 MG PO TABS
2.0000 | ORAL_TABLET | ORAL | Status: DC
  Administered 2017-12-07 – 2017-12-08 (×2): 2 via ORAL
  Filled 2017-12-07 (×2): qty 2

## 2017-12-07 MED ORDER — OXYTOCIN 40 UNITS IN LACTATED RINGERS INFUSION - SIMPLE MED
1.0000 m[IU]/min | INTRAVENOUS | Status: DC
  Administered 2017-12-07: 2 m[IU]/min via INTRAVENOUS
  Administered 2017-12-07: 4 m[IU]/min via INTRAVENOUS
  Filled 2017-12-07: qty 1000

## 2017-12-07 MED ORDER — MEASLES, MUMPS & RUBELLA VAC ~~LOC~~ INJ
0.5000 mL | INJECTION | Freq: Once | SUBCUTANEOUS | Status: DC
  Filled 2017-12-07: qty 0.5

## 2017-12-07 MED ORDER — LACTATED RINGERS IV SOLN
INTRAVENOUS | Status: DC
  Administered 2017-12-07 (×2): via INTRAVENOUS

## 2017-12-07 MED ORDER — ONDANSETRON HCL 4 MG/2ML IJ SOLN: 4.0000 mg | INTRAMUSCULAR | Status: DC | PRN

## 2017-12-07 MED ORDER — COCONUT OIL OIL: 1.0000 "application " | TOPICAL_OIL | Status: DC | PRN

## 2017-12-07 MED ORDER — SIMETHICONE 80 MG PO CHEW: 80.0000 mg | CHEWABLE_TABLET | ORAL | Status: DC | PRN

## 2017-12-07 MED ORDER — WITCH HAZEL-GLYCERIN EX PADS: 1.0000 "application " | MEDICATED_PAD | CUTANEOUS | Status: DC | PRN

## 2017-12-07 MED ORDER — DIPHENHYDRAMINE HCL 25 MG PO CAPS: 25.0000 mg | ORAL_CAPSULE | Freq: Four times a day (QID) | ORAL | Status: DC | PRN

## 2017-12-07 MED ORDER — BENZOCAINE-MENTHOL 20-0.5 % EX AERO
1.0000 "application " | INHALATION_SPRAY | CUTANEOUS | Status: DC | PRN
  Administered 2017-12-08: 1 via TOPICAL
  Filled 2017-12-07: qty 56

## 2017-12-07 MED ORDER — ACETAMINOPHEN 325 MG PO TABS: 650.0000 mg | ORAL_TABLET | ORAL | Status: DC | PRN

## 2017-12-07 MED ORDER — DIPHENHYDRAMINE HCL 50 MG/ML IJ SOLN: 12.5000 mg | INTRAMUSCULAR | Status: DC | PRN

## 2017-12-07 MED ORDER — OXYTOCIN BOLUS FROM INFUSION
500.0000 mL | Freq: Once | INTRAVENOUS | Status: AC
  Administered 2017-12-07: 500 mL via INTRAVENOUS

## 2017-12-07 MED ORDER — ONDANSETRON HCL 4 MG PO TABS: 4.0000 mg | ORAL_TABLET | ORAL | Status: DC | PRN

## 2017-12-07 MED ORDER — OXYTOCIN 40 UNITS IN LACTATED RINGERS INFUSION - SIMPLE MED
2.5000 [IU]/h | INTRAVENOUS | Status: DC
  Administered 2017-12-07: 2.5 [IU]/h via INTRAVENOUS

## 2017-12-07 MED ORDER — ONDANSETRON HCL 4 MG/2ML IJ SOLN: 4.0000 mg | Freq: Four times a day (QID) | INTRAMUSCULAR | Status: DC | PRN

## 2017-12-07 MED ORDER — DIBUCAINE 1 % RE OINT: 1.0000 "application " | TOPICAL_OINTMENT | RECTAL | Status: DC | PRN

## 2017-12-07 MED ORDER — MISOPROSTOL 25 MCG QUARTER TABLET: 25.0000 ug | ORAL_TABLET | ORAL | Status: DC | PRN

## 2017-12-07 MED ORDER — LACTATED RINGERS IV SOLN
500.0000 mL | INTRAVENOUS | Status: DC | PRN
  Administered 2017-12-07: 500 mL via INTRAVENOUS

## 2017-12-07 MED ORDER — PHENYLEPHRINE 40 MCG/ML (10ML) SYRINGE FOR IV PUSH (FOR BLOOD PRESSURE SUPPORT)
80.0000 ug | PREFILLED_SYRINGE | INTRAVENOUS | Status: DC | PRN
  Filled 2017-12-07: qty 5
  Filled 2017-12-07: qty 10

## 2017-12-07 MED ORDER — IBUPROFEN 600 MG PO TABS
600.0000 mg | ORAL_TABLET | Freq: Four times a day (QID) | ORAL | Status: DC
  Administered 2017-12-07 – 2017-12-09 (×6): 600 mg via ORAL
  Filled 2017-12-07 (×6): qty 1

## 2017-12-07 MED ORDER — TETANUS-DIPHTH-ACELL PERTUSSIS 5-2.5-18.5 LF-MCG/0.5 IM SUSP: 0.5000 mL | Freq: Once | INTRAMUSCULAR | Status: DC

## 2017-12-07 MED ORDER — OXYCODONE-ACETAMINOPHEN 5-325 MG PO TABS: 2.0000 | ORAL_TABLET | ORAL | Status: DC | PRN

## 2017-12-07 NOTE — Anesthesia Procedure Notes (Signed)
Epidural Patient location during procedure: OB Start time: 12/07/2017 6:04 PM End time: 12/07/2017 6:14 PM  Staffing Anesthesiologist: Murvin Natal, MD Performed: anesthesiologist   Preanesthetic Checklist Completed: patient identified, site marked, pre-op evaluation, timeout performed, IV checked, risks and benefits discussed and monitors and equipment checked  Epidural Patient position: sitting Prep: DuraPrep Patient monitoring: heart rate, cardiac monitor, continuous pulse ox and blood pressure Approach: midline Location: L4-L5 Injection technique: LOR air  Needle:  Needle type: Tuohy  Needle gauge: 17 G Needle length: 9 cm Needle insertion depth: 6 cm Catheter type: closed end flexible Catheter size: 19 Gauge Catheter at skin depth: 11 cm Test dose: negative and Other  Assessment Events: blood not aspirated, injection not painful, no injection resistance and negative IV test  Additional Notes Informed consent obtained prior to proceeding including risk of failure, 1% risk of PDPH, risk of minor discomfort and bruising. Discussed alternatives to epidural analgesia and patient desires to proceed.  Timeout performed pre-procedure verifying patient name, procedure, and platelet count.  Patient tolerated procedure well. Reason for block:procedure for pain

## 2017-12-07 NOTE — Anesthesia Preprocedure Evaluation (Signed)
Anesthesia Evaluation  Patient identified by MRN, date of birth, ID band Patient awake    Reviewed: Allergy & Precautions, H&P , NPO status , Patient's Chart, lab work & pertinent test results  History of Anesthesia Complications Negative for: history of anesthetic complications  Airway Mallampati: II  TM Distance: >3 FB Neck ROM: full    Dental no notable dental hx. (+) Teeth Intact   Pulmonary neg pulmonary ROS,    Pulmonary exam normal breath sounds clear to auscultation       Cardiovascular negative cardio ROS Normal cardiovascular exam Rhythm:regular Rate:Normal     Neuro/Psych negative neurological ROS  negative psych ROS   GI/Hepatic negative GI ROS, Neg liver ROS,   Endo/Other  negative endocrine ROS  Renal/GU negative Renal ROS  negative genitourinary   Musculoskeletal negative musculoskeletal ROS (+)   Abdominal   Peds negative pediatric ROS (+)  Hematology negative hematology ROS (+)   Anesthesia Other Findings   Reproductive/Obstetrics (+) Pregnancy                             Anesthesia Physical Anesthesia Plan  ASA: II  Anesthesia Plan: Epidural   Post-op Pain Management:    Induction:   PONV Risk Score and Plan:   Airway Management Planned:   Additional Equipment:   Intra-op Plan:   Post-operative Plan:   Informed Consent: I have reviewed the patients History and Physical, chart, labs and discussed the procedure including the risks, benefits and alternatives for the proposed anesthesia with the patient or authorized representative who has indicated his/her understanding and acceptance.     Plan Discussed with:   Anesthesia Plan Comments:         Anesthesia Quick Evaluation

## 2017-12-07 NOTE — MAU Provider Note (Signed)
Chief Complaint:  Vaginal Bleeding   First Provider Initiated Contact with Patient 12/07/17 1222      HPI: Teresa Marsh is a 37 y.o. [id number] at 70w6dwho presents to maternity admissions reporting an episode of bright red vaginal bleeding in the shower this morning. The bleeding has since resolved but she came to be evaluated. She has irregular contractions. There are no other symptoms. She has not tried any treatments.  She reports good fetal movement, denies LOF, vaginal itching/burning, urinary symptoms, h/a, dizziness, n/v, or fever/chills.    HPI  Past Medical History: Past Medical History:  Diagnosis Date  . Complication of anesthesia    shortness of breath  . Dyspnea     Past obstetric history: OB History  Gravida Para Term Preterm AB Living  4 2 2   1 2   SAB TAB Ectopic Multiple Live Births  1       2    # Outcome Date GA Lbr Len/2nd Weight Sex Delivery Anes PTL Lv  4 Current           3 SAB 01/2017          2 Term 2017 [redacted]w[redacted]d  3941 g M Vag-Spont EPI N LIV  1 Term 2013 [redacted]w[redacted]d  3300 g M Vag-Spont EPI N LIV    Past Surgical History: History reviewed. No pertinent surgical history.  Family History: Family History  Problem Relation Age of Onset  . Diabetes Maternal Grandmother     Social History: Social History   Tobacco Use  . Smoking status: Never Smoker  . Smokeless tobacco: Never Used  Substance Use Topics  . Alcohol use: No    Frequency: Never  . Drug use: No    Allergies: No Known Allergies  Meds:  Medications Prior to Admission  Medication Sig Dispense Refill Last Dose  . Prenat w/o A Vit-FeFum-FePo-FA (CONCEPT OB) 130-92.4-1 MG CAPS Take 1 tablet by mouth daily. 30 capsule 12 Taking    ROS:  Review of Systems  Constitutional: Negative for chills, fatigue and fever.  Eyes: Negative for visual disturbance.  Respiratory: Negative for shortness of breath.   Cardiovascular: Negative for chest pain.  Gastrointestinal: Negative for abdominal  pain, nausea and vomiting.  Genitourinary: Negative for difficulty urinating, dysuria, flank pain, pelvic pain, vaginal bleeding, vaginal discharge and vaginal pain.  Neurological: Negative for dizziness and headaches.  Psychiatric/Behavioral: Negative.      I have reviewed patient's Past Medical Hx, Surgical Hx, Family Hx, Social Hx, medications and allergies.   Physical Exam   Patient Vitals for the past 24 hrs:  BP Temp Temp src Pulse Resp SpO2 Height Weight  12/07/17 1902 120/74 - - 96 17 - - -  12/07/17 1846 126/76 - - 87 18 - - -  12/07/17 1841 124/78 - - 91 18 - - -  12/07/17 1836 114/74 97.7 F (36.5 C) Oral (!) 107 19 - - -  12/07/17 1831 116/70 - - 90 18 - - -  12/07/17 1826 122/74 - - 93 18 - - -  12/07/17 1821 124/83 - - 94 18 - - -  12/07/17 1816 138/86 - - (!) 106 18 - - -  12/07/17 1727 110/69 - - 82 18 - - -  12/07/17 1706 127/79 98.9 F (37.2 C) Oral 86 18 - - -  12/07/17 1624 122/81 - - 83 18 - - -  12/07/17 1440 115/76 97.9 F (36.6 C) Oral 86 18 - 4\' 10"  (1.473 m)  74.4 kg  12/07/17 1029 116/68 97.6 F (36.4 C) Oral - 16 97 % - -   Constitutional: Well-developed, well-nourished female in no acute distress.  Cardiovascular: normal rate Respiratory: normal effort GI: Abd soft, non-tender, gravid appropriate for gestational age.  MS: Extremities nontender, no edema, normal ROM Neurologic: Alert and oriented x 4.  GU: Neg CVAT.  PELVIC EXAM: Cervix pink, visually closed, without lesion, large dark red clot at cervical os, removed with 1 fox swab, no additional bleeding noted, and external genitalia normal  Dilation: 4 Effacement (%): 50 Cervical Position: Middle Station: -1 Presentation: Vertex Exam QI:HKVQ Leftwich-Kirby, CNM  FHT:  Baseline 135 , moderate variability, accelerations present, no decelerations Contractions: q 10 mins   Labs: Results for orders placed or performed during the hospital encounter of 12/07/17 (from the past 24 hour(s))   Fern Test     Status: None   Collection Time: 12/07/17  3:01 PM  Result Value Ref Range   POCT Fern Test Positive = ruptured amniotic membanes   CBC     Status: Abnormal   Collection Time: 12/07/17  3:51 PM  Result Value Ref Range   WBC 12.9 (H) 4.0 - 10.5 K/uL   RBC 4.07 3.87 - 5.11 MIL/uL   Hemoglobin 13.5 12.0 - 15.0 g/dL   HCT 39.0 36.0 - 46.0 %   MCV 95.8 78.0 - 100.0 fL   MCH 33.2 26.0 - 34.0 pg   MCHC 34.6 30.0 - 36.0 g/dL   RDW 13.9 11.5 - 15.5 %   Platelets 247 150 - 400 K/uL  Type and screen     Status: None   Collection Time: 12/07/17  3:51 PM  Result Value Ref Range   ABO/RH(D) O POS    Antibody Screen NEG    Sample Expiration      12/10/2017 Performed at [facility], [address] [place], [place] [address]    --/--/O POS (10/05 1551)  Imaging:  No results found.  MAU Course/MDM: I have ordered labs and reviewed results.  NST reviewed and reactive Pt [redacted]w[redacted]d with vaginal bleeding and painful regular contractions.   Consult Roselie Awkward with presentation, exam findings and test results.  Admit to [facility] for labor, augment as needed   Assessment: Active labor at term GBS negative  Plan: Admit to [employer] Certified Nurse-Midwife 12/07/2017 7:09 PM

## 2017-12-07 NOTE — H&P (Addendum)
LABOR AND DELIVERY ADMISSION HISTORY AND PHYSICAL NOTE  Teresa Marsh is a 37 y.o. female [phone] with IUP at [redacted]w[redacted]d by U/S presenting in SOL.  She reports positive fetal movement. She notes some mild vaginal bleeding this morning, presented to the MAU for further evaluation. Denies any dizziness or HA.    Prenatal History/Complications: PNC at [facility] Pregnancy complications: Uterine size date discrepancy (normal EFW, uterine fibroids may have been contributing), advanced maternal age, breech presentation s/p successful version 9/13  Past Medical History: Past Medical History:  Diagnosis Date  . Complication of anesthesia    shortness of breath  . Dyspnea     Past Surgical History: History reviewed. No pertinent surgical history.  Obstetrical History: OB History    Gravida  4   Para  2   Term  2   Preterm      AB  1   Living  2     SAB  1   TAB      Ectopic      Multiple      Live Births  2           Social History: Social History   Socioeconomic History  . Marital status: Married    Spouse name: Not on file  . Number of children: Not on file  . Years of education: Not on file  . Highest education level: Not on file  Occupational History  . Not on file  Social Needs  . Financial resource strain: Not very hard  . Food insecurity:    Worry: Never true    Inability: Never true  . Transportation needs:    Medical: No    Non-medical: No  Tobacco Use  . Smoking status: Never Smoker  . Smokeless tobacco: Never Used  Substance and Sexual Activity  . Alcohol use: No    Frequency: Never  . Drug use: No  . Sexual activity: Not Currently    Birth control/protection: Condom  Lifestyle  . Physical activity:    Days per week: 0 days    Minutes per session: 0 min  . Stress: Only a little  Relationships  . Social connections:    Talks on phone: More than three times a week    Gets together: More than three times a week    Attends religious  service: 1 to 4 times per year    Active member of club or organization: No    Attends meetings of clubs or organizations: Never    Relationship status: Married  Other Topics Concern  . Not on file  Social History Narrative  . Not on file    Family History: Family History  Problem Relation Age of Onset  . Diabetes Maternal Grandmother     Allergies: No Known Allergies  Medications Prior to Admission  Medication Sig Dispense Refill Last Dose  . Prenat w/o A Vit-FeFum-FePo-FA (CONCEPT OB) 130-92.4-1 MG CAPS Take 1 tablet by mouth daily. 30 capsule 12 Taking     Review of Systems  All systems reviewed and negative except as stated in HPI  Physical Exam Blood pressure 115/76, pulse 86, temperature 97.9 F (36.6 C), temperature source Oral, resp. rate 18, height 4\' 10"  (1.473 m), weight 74.4 kg, last menstrual period 02/24/2017, SpO2 97 %, unknown if currently breastfeeding. General appearance: alert, oriented, NAD Lungs: normal respiratory effort Heart: regular rate Abdomen: soft, non-tender; gravid, FH appropriate for GA Extremities: No calf swelling or tenderness Presentation: cephalic confirmed by U/S  Fetal monitoring: baseline 135, mod variability, + accels, - decels   Uterine activity: ~ every 5  Dilation: 4 Effacement (%): 50 Station: -2 Exam by:: Fatima Blank, CNM  Prenatal labs: ABO, Rh: O/Positive/-- (03/06 1013) Antibody: Negative (03/06 1013) Rubella: 21.30 (03/06 1013) RPR: Non Reactive (07/03 0821)  HBsAg: Negative (03/06 1013)  HIV: Non Reactive (07/03 0821)  GC/Chlamydia: Negative GBS:   Negative  2-hr GTT: Normal  Genetic screening: Declined  Anatomy US: Normal female, succenturiate lobe (not visualized on follow up ultrasound). 2 small fibroids (2 Cm)   Prenatal Transfer Tool  Maternal Diabetes: No Genetic Screening: Declined Maternal Ultrasounds/Referrals: Normal Fetal Ultrasounds or other Referrals:  None Maternal Substance Abuse:   No Significant Maternal Medications:  None Significant Maternal Lab Results: Lab values include: Group B Strep negative  Results for orders placed or performed during the hospital encounter of 12/07/17 (from the past 24 hour(s))  Fern Test   Collection Time: 12/07/17  3:01 PM  Result Value Ref Range   POCT Fern Test Positive = ruptured amniotic membanes     Patient Active Problem List   Diagnosis Date Noted  . Encounter for induction of labor 12/07/2017  . Breech presentation 11/15/2017  . Uterine fibroids affecting pregnancy 09/25/2017  . Language barrier 07/04/2017  . Supervision of other normal pregnancy, antepartum 05/08/2017  . AMA (advanced maternal age) multigravida 35+ 05/08/2017  . Uterine size date discrepancy pregnancy, third trimester 05/08/2017    Assessment: Teresa Marsh is a 37 y.o. [id number] at [redacted]w[redacted]d here for SOL with ROM/positive ferning. Pregnancy course unremarkable, however complicated by advanced maternal age and breech presentation s/p successful version 9/23, confirmed vertex with ultrasound today. GBS negative.   #Labor: Minimal change since previous exam. Start pit 2x2, SROM prior to arrival. Serial cervical checks.  #Pain: Requests epidural.  #FWB: Cat 1 strip, vertex confirmed with U/S.  #ID: GBS negative.  #MOF: Breastfeeding  #MOC: IUD outpatient  #Circ: N/A  [facility] PGY-1 12/07/2017, 4:01 PM   OB FELLOW HISTORY AND PHYSICAL ATTESTATION  I have seen and examined this patient; I agree with above documentation in the resident's note.   Phill Myron, D.O. OB Fellow  12/07/2017, 5:21 PM

## 2017-12-07 NOTE — MAU Note (Signed)
Pt noticed some bright red bleeding while she was taking a shower this morning.  Reports good fetal movement.  Denies LOF, some contractions around 5 min apart.

## 2017-12-08 ENCOUNTER — Inpatient Hospital Stay (HOSPITAL_COMMUNITY): Admission: RE | Admit: 2017-12-08 | Payer: Medicaid Other | Source: Ambulatory Visit

## 2017-12-08 LAB — CBC
HCT: 34.2 % — ABNORMAL LOW (ref 36.0–46.0)
Hemoglobin: 11.7 g/dL — ABNORMAL LOW (ref 12.0–15.0)
MCH: 32.6 pg (ref 26.0–34.0)
MCHC: 34.2 g/dL (ref 30.0–36.0)
MCV: 95.3 fL (ref 78.0–100.0)
PLATELETS: 211 10*3/uL (ref 150–400)
RBC: 3.59 MIL/uL — ABNORMAL LOW (ref 3.87–5.11)
RDW: 13.8 % (ref 11.5–15.5)
WBC: 14.4 10*3/uL — AB (ref 4.0–10.5)

## 2017-12-08 LAB — RPR: RPR Ser Ql: NONREACTIVE

## 2017-12-08 MED ORDER — INFLUENZA VAC SPLIT QUAD 0.5 ML IM SUSY
0.5000 mL | PREFILLED_SYRINGE | INTRAMUSCULAR | Status: AC | PRN
  Administered 2017-12-09: 0.5 mL via INTRAMUSCULAR
  Filled 2017-12-08: qty 0.5

## 2017-12-08 NOTE — Lactation Note (Signed)
This note was copied from a baby's chart. Lactation Consultation Note  Patient Name: Teresa Marsh IBBCW'U Date: 12/08/2017 Reason for consult: Initial assessment;Term  P3 mother whose infant is now 22hours old  RN requested latch assistance.  Mother has a family member that has agreed to interpret for mother.  Baby was sleeping in mother's arms when I arrived and not showing feeding cues.  Offered to assist with latch and mother accepted.  Discussed the importance of removing blanket and putting baby STS.    Mother's breasts are soft and non tender and nipples are everted bilaterally.  Assisted to latch in the cross cradle position on the left breast without difficulty.  Baby sucked a couple of times and pulled off.  Repeated this a couple more times but baby not interested in feeding.    Mother complaining of a lot of uterine pain so I  called for pain meds.  She requested that I help her later today and I agreed.  Mother will call me when baby shows cues and her pain has eased. Mother will hold baby until the next feeding time.    Family member present and supportive.     Maternal Data Formula Feeding for Exclusion: No Has patient been taught Hand Expression?: Yes Does the patient have breastfeeding experience prior to this delivery?: Yes  Feeding Feeding Type: Breast Fed  LATCH Score Latch: Repeated attempts needed to sustain latch, nipple held in mouth throughout feeding, stimulation needed to elicit sucking reflex.  Audible Swallowing: None  Type of Nipple: Everted at rest and after stimulation  Comfort (Breast/Nipple): Soft / non-tender  Hold (Positioning): Assistance needed to correctly position infant at breast and maintain latch.  LATCH Score: 6  Interventions Interventions: Breast feeding basics reviewed;Assisted with latch;Skin to skin;Hand express;Position options;Support pillows;Adjust position  Lactation Tools Discussed/Used     Consult  Status Consult Status: Follow-up Date: 12/09/17 Follow-up type: In-patient    Jillyan Plitt R Alexander Mcauley 12/08/2017, 9:26 AM

## 2017-12-08 NOTE — Anesthesia Postprocedure Evaluation (Signed)
Anesthesia Post Note  Patient: Statistician  Procedure(s) Performed: AN AD [place]     Patient location during evaluation: Mother Baby Anesthesia Type: Epidural Level of consciousness: awake Pain management: satisfactory to patient Vital Signs Assessment: post-procedure vital signs reviewed and stable Respiratory status: spontaneous breathing Cardiovascular status: stable Anesthetic complications: no    Last Vitals:  Vitals:   12/07/17 2340 12/08/17 0400  BP: 117/75 102/65  Pulse: 93 79  Resp: 18 18  Temp: 37.5 C 36.6 C  SpO2: 100% 100%    Last Pain:  Vitals:   12/08/17 0519  TempSrc:   PainSc: 6    Pain Goal: Patients Stated Pain Goal: 3 (12/08/17 0519)               Casimer Lanius

## 2017-12-08 NOTE — Progress Notes (Addendum)
POSTPARTUM PROGRESS NOTE  Post Partum Day 1 Subjective:  Teresa Marsh is a 37 y.o. [id number] [redacted]w[redacted]d s/p SVD.  No other acute events overnight.  Pt denies problems with ambulating, voiding or po intake.  She denies nausea or vomiting.  Pain is moderately controlled.  She has not had flatus. She has not had bowel movement.  Lochia Moderate.   Objective: Blood pressure 127/90, pulse 82, temperature 97.8 F (36.6 C), temperature source Oral, resp. rate 18, height 4\' 10"  (1.473 m), weight 74.4 kg, last menstrual period 02/24/2017, SpO2 100 %, unknown if currently breastfeeding.  Physical Exam:  General: alert, cooperative and no distress Lochia: moderate but decreasing Chest: CTAB Heart: RRR no m/r/g Abdomen: +BS, soft, mild tenderness diffusely Uterine Fundus: firm DVT Evaluation: No calf swelling or tenderness Extremities: no LE edema b/l  Recent Labs    12/07/17 1551  HGB 13.5  HCT 39.0    Assessment/Plan:  ASSESSMENT: Teresa Marsh is a 37 y.o. [phone] [redacted]w[redacted]d s/p SVD  #Breast& bottlefeeding #Contraception: IUD on 4 weeks post-partum check #Circ: N/A (girl) #CBC this am, endorsed dizziness, currently HDS, EBL 317mL, Hgb 13.5 prior to delivery  #Discharge PPD #2    LOS: 1 day   Vassie Moselle, Medical Student 12/08/2017, 9:07 AM   OB [place]  I confirm that I have verified the information documented in the medical student's note and that I have also personally performed the physical exam and all medical decision making activities.   Teresa Marsh is 37 y.o. [phone] female PPD #1 from SVD. Patient reports feeling well. Ambulating without difficulty, voiding normally, lochia is mild, pain is well controlled. Vital signs stable. Physical exam benign with firm uterine fundus and no LE edema. Patient is breast feeding. Plans for IUD for contraception. Circ N/A. Plan for d/c tomorrow.   Phill Myron, D.O. OB Fellow  12/08/2017, 9:27  AM

## 2017-12-09 NOTE — Lactation Note (Signed)
This note was copied from a baby's chart. Lactation Consultation Note Used [employer] interpreter [phone] Lam for consult. Baby 34 hrs old. Baby wanting to cluster feed. Mom asking for formula. Has had [facility] previously. Mom is breast/formula on admission. Mom BF her 37 yr old and 2 yr old for 6 months w/breast/formula. Mom has long everted nipples, compressible. Hand expression for several minutes w/no colostrum noted. LC hand expressed several different times. Gave mom hand pump. Baby's lips look a little dry. Not chapped. Out put ok.  Baby has elevated TCB. Serum is being drawn.  Discussed breast massage, newborn feeding habits, STS, I&O, jaundice, supportive bra, engorgement, breast filling, nipple care, assessing for milk transfer. Encouraged mom to BF before giving formula.  Ask to call for assistance or questions.  Patient Name: Teresa Marsh [id number] Date: 12/09/2017 Reason for consult: Follow-up assessment   Maternal Data    Feeding Feeding Type: Breast Fed(Simultaneous filing. User may not have seen previous data.) Nipple Type: Slow - flow  LATCH Score Latch: Grasps breast easily, tongue down, lips flanged, rhythmical sucking.  Audible Swallowing: A few with stimulation  Type of Nipple: Everted at rest and after stimulation  Comfort (Breast/Nipple): Filling, red/small blisters or bruises, mild/mod discomfort  Hold (Positioning): No assistance needed to correctly position infant at breast.  LATCH Score: 8  Interventions Interventions: Adjust position;Support pillows;Assisted with latch;Skin to skin;Position options;Breast massage;Hand express;Breast compression;Hand pump  Lactation Tools Discussed/Used Tools: Pump Breast pump type: Manual WIC Program: Yes Pump Review: Setup, frequency, and cleaning;Milk Storage Initiated by:: Allayne Stack RN IBCLC Date initiated:: 12/09/17   Consult Status Consult Status: Follow-up Date: 12/09/17(in pm.) Follow-up type:  In-patient    Camarion Weier, Elta [place] 12/09/2017, 5:22 AM

## 2017-12-09 NOTE — Discharge Summary (Signed)
OB Discharge Summary     Patient Name: Teresa Marsh DOB: February 06, 1981 MRN: [id number]  Date of admission: 12/07/2017 Delivering MD: Caroline More   Date of discharge: 12/09/2017  Admitting diagnosis: 72 WKS CTX Intrauterine pregnancy: [redacted]w[redacted]d     Secondary diagnosis:  Active Problems:   Encounter for induction of labor   Normal spontaneous vaginal delivery   SVD (spontaneous vaginal delivery)  Additional problems: none     Discharge diagnosis: Term Pregnancy Delivered                                                                                                Post partum procedures:none  Augmentation: Pitocin  Complications: None  Hospital course:  Onset of Labor With Vaginal Delivery     37 y.o. yo [id number] at [redacted]w[redacted]d was admitted in Active Labor on 12/07/2017. Patient had an uncomplicated labor course as follows:  Membrane Rupture Time/Date: 1:50 PM ,12/07/2017   Intrapartum Procedures: Episiotomy: None [1]                                         Lacerations:  2nd degree [3];Perineal [11]  Patient had a delivery of a Viable infant. 12/07/2017  Information for the patient's newborn:  Twanda, Stakes [[id number]]  Delivery Method: Vag-Spont    Pateint had an uncomplicated postpartum course.  She is ambulating, tolerating a regular diet, passing flatus, and urinating well. Patient is discharged home in stable condition on 12/09/17.   Physical exam  Vitals:   12/08/17 0816 12/08/17 1245 12/08/17 2155 12/09/17 0530  BP: 127/90 110/65 113/79 112/75  Pulse: 82 81 86 77  Resp: 18 18 18 16   Temp: 97.8 F (36.6 C) 98.4 F (36.9 C) 98.4 F (36.9 C) 98.2 F (36.8 C)  TempSrc: Oral Oral Oral Axillary  SpO2:      Weight:      Height:       General: alert, cooperative and no distress Lochia: appropriate Uterine Fundus: firm Incision: N/A DVT Evaluation: No evidence of DVT seen on physical exam. No cords or calf tenderness. No significant calf/ankle edema. Labs: Lab  Results  Component Value Date   WBC 14.4 (H) 12/08/2017   HGB 11.7 (L) 12/08/2017   HCT 34.2 (L) 12/08/2017   MCV 95.3 12/08/2017   PLT 211 12/08/2017   CMP Latest Ref Rng & Units 01/21/2017  Glucose 65 - 99 mg/dL 100(H)  BUN 6 - 20 mg/dL 14  Creatinine 0.44 - 1.00 mg/dL 0.60  Sodium 135 - 145 mmol/L 140  Potassium 3.5 - 5.1 mmol/L 3.5  Chloride 101 - 111 mmol/L 104    Discharge instruction: per After Visit Summary and "Baby and Me Booklet".  After visit meds:  Allergies as of 12/09/2017   No Known Allergies     Medication List    TAKE these medications   CONCEPT OB 130-92.4-1 MG Caps Take 1 tablet by mouth daily.       Diet: routine diet  Activity: Advance  as tolerated. Pelvic rest for 6 weeks.   Outpatient follow up:4 weeks Follow up Appt:No future appointments. Follow up Visit:No follow-ups on file.  Postpartum contraception: IUD Mirena  Newborn Data: Live born female  Birth Weight: 7 lb 1.6 oz (3221 g) APGAR: 9, 9  Newborn Delivery   Birth date/time:  12/07/2017 21:02:00 Delivery type:  Vaginal, Spontaneous     Baby Feeding: Breast Disposition:home with mother   12/09/2017 Lajean Manes, CNM

## 2018-01-06 ENCOUNTER — Encounter: Payer: Self-pay | Admitting: Advanced Practice Midwife

## 2018-01-06 ENCOUNTER — Ambulatory Visit (INDEPENDENT_AMBULATORY_CARE_PROVIDER_SITE_OTHER): Payer: Medicaid Other | Admitting: Advanced Practice Midwife

## 2018-01-06 DIAGNOSIS — Z1389 Encounter for screening for other disorder: Secondary | ICD-10-CM | POA: Diagnosis not present

## 2018-01-06 DIAGNOSIS — Z3009 Encounter for other general counseling and advice on contraception: Secondary | ICD-10-CM

## 2018-01-06 DIAGNOSIS — Z30430 Encounter for insertion of intrauterine contraceptive device: Secondary | ICD-10-CM

## 2018-01-06 MED ORDER — IBUPROFEN 800 MG PO TABS: 800.0000 mg | ORAL_TABLET | Freq: Three times a day (TID) | ORAL | 0 refills | Status: DC | PRN

## 2018-01-06 MED ORDER — LEVONORGESTREL 19.5 MCG/DAY IU IUD
INTRAUTERINE_SYSTEM | Freq: Once | INTRAUTERINE | Status: AC
  Administered 2018-01-06: 15:00:00 via INTRAUTERINE

## 2018-01-06 NOTE — Patient Instructions (Signed)

## 2018-01-06 NOTE — Progress Notes (Signed)
Subjective:     Teresa Marsh is a 37 y.o. female who presents for a postpartum visit. She is 6 weeks postpartum following a spontaneous vaginal delivery. I have fully reviewed the prenatal and intrapartum course. The delivery was at 40.6 gestational weeks. Outcome: spontaneous vaginal delivery. Anesthesia: epidural. Postpartum course has been unremarkable. Baby's course has been unremarkable. Baby is feeding by breast. Bleeding staining only. Bowel function is normal. Bladder function is normal. Patient is not sexually active. Contraception method is IUD. Postpartum depression screening: negative.  The following portions of the patient's history were reviewed and updated as appropriate: allergies, current medications, past family history, past medical history, past social history, past surgical history and problem list.  Review of Systems Pertinent items are noted in HPI.   Objective:    BP 118/77   Pulse 84   Wt 142 lb (64.4 kg)   BMI 29.68 kg/m    Physical Exam  Constitutional: She is oriented to person, place, and time. She appears well-developed and well-nourished.  HENT:  Head: Normocephalic.  Cardiovascular: Normal rate.  Pulmonary/Chest: Effort normal.  Abdominal: Soft.  Genitourinary: Vagina normal.  Musculoskeletal: Normal range of motion.  Neurological: She is alert and oriented to person, place, and time.  Skin: Skin is warm and dry.  Psychiatric: She has a normal mood and affect.  Nursing note and vitals reviewed.  GYNECOLOGY OFFICE PROCEDURE NOTE  Teresa Marsh is a 37 y.o. [phone] here for [place] IUD insertion. No GYN concerns.  Last pap smear was on 05/08/17 and was normal.  IUD Insertion Procedure Note Patient identified, informed consent performed, consent signed.   Discussed risks of irregular bleeding, cramping, infection, malpositioning or misplacement of the IUD outside the uterus which may require further procedure such as laparoscopy. Time out was  performed.  Urine pregnancy test negative.  Speculum placed in the vagina.  Cervix visualized.  Cleaned with Betadine x 2.  Grasped anteriorly with a single tooth tenaculum.  Uterus sounded to 9 cm.  Liletta IUD placed per manufacturer's recommendations.  Strings trimmed to 3 cm. Tenaculum was removed, good hemostasis noted.  Patient tolerated procedure well.   Patient was given post-procedure instructions.  She was advised to have backup contraception for one week.  Patient was also asked to check IUD strings periodically and follow up in 4 weeks for IUD check.    Assessment:     Normal postpartum exam. Pap smear not done at today's visit. Had pap 05/08/17, NIL, HPV negative   Plan:    1. Contraception: IUD 2. Routine care 3. Follow up in: 4 weeks for IUD check  or as needed.

## 2018-01-08 ENCOUNTER — Encounter: Payer: Self-pay | Admitting: *Deleted

## 2018-02-03 ENCOUNTER — Encounter: Payer: Self-pay | Admitting: Advanced Practice Midwife

## 2018-02-03 ENCOUNTER — Ambulatory Visit (INDEPENDENT_AMBULATORY_CARE_PROVIDER_SITE_OTHER): Payer: Medicaid Other | Admitting: Advanced Practice Midwife

## 2018-02-03 VITALS — BP 113/74 | HR 74 | Wt 147.0 lb

## 2018-02-03 DIAGNOSIS — Z30431 Encounter for routine checking of intrauterine contraceptive device: Secondary | ICD-10-CM | POA: Diagnosis not present

## 2018-02-03 NOTE — Progress Notes (Signed)
GYNECOLOGY OFFICE PROGRESS NOTE  History:  37 y.o. [id number] here today for today for IUD string check; Liletta IUD was placed  01/06/18. No complaints about the IUD, no concerning side effects. Some spotting off and on.   The following portions of the patient's history were reviewed and updated as appropriate: allergies, current medications, past family history, past medical history, past social history, past surgical history and problem list. Last pap smear on 05/2017 was normal, negative HRHPV.  Review of Systems:  Pertinent items are noted in HPI.   Objective:  Physical Exam Blood pressure 113/74, pulse 74, weight 147 lb (66.7 kg), unknown if currently breastfeeding. CONSTITUTIONAL: Well-developed, well-nourished female in no acute distress.  HENT:  Normocephalic, atraumatic. External right and left ear normal. Oropharynx is clear and moist EYES: Conjunctivae and EOM are normal. Pupils are equal, round, and reactive to light. No scleral icterus.  NECK: Normal range of motion, supple, no masses CARDIOVASCULAR: Normal heart rate noted RESPIRATORY: Effort and breath sounds normal, no problems with respiration noted ABDOMEN: Soft, no distention noted.   PELVIC: Normal appearing external genitalia; normal appearing vaginal mucosa and cervix.  IUD strings visualized, about 3 cm in length outside cervix.   Assessment & Plan:  Normal IUD check. Patient to keep IUD in place for five years; can come in for removal if she desires pregnancy within the next five years. Routine preventative health maintenance measures emphasized.  Muhamed Luecke 1:25 PM 02/03/18

## 2018-02-03 NOTE — Patient Instructions (Signed)
Levonorgestrel intrauterine device (IUD) ?y l thu?c g? LEVONORGESTREL D?ng C? ??t Trong T? Cung l d?ng c? trnh [place] (ng?a [place] D?ng c? ny ???c ??t vo bn trong t? cung b?i m?t [place] vin y t?. N ???c dng ?? ng?a [place]. D?ng c? ny c?ng c th? ???c dng ?? ?i?u tr? tnh tr?ng b? ra huy?t nhi?u trong k? kinh. Thu?c ny c th? ???c dng cho nh?ng m?c ?ch khc; hy h?i ng??i cung c?p d?ch v? y t? ho?c d??c s? c?a mnh, n?u qu v? c th?c m?c. (CC) NHN HI?U PH? BI?N: Kyleena, LILETTA, Mirena, Skyla Ti c?n ph?i bo cho ng??i cung c?p d?ch v? y t? c?a mnh ?i?u g tr??c khi dng thu?c ny? H? c?n bi?t li?u qu v? c b?t k? tnh tr?ng no sau ?y khng: -phi?n ?? m ??o (Pap smear) b?t th??ng -ung th? v, t? cung, ho?c c? t? cung -b?nh ti?u ???ng -vim n?i m?c t? cung -vim c? quan sinh d?c ho?c vim vng khung ch?u hi?n t?i ho?c trong qu kh? -c nhi?u h?n m?t b?n tnh ho?c b?n tnh c?a qu v? c nhi?u h?n m?t b?n tnh -b?nh tim -ti?n s? b? mang thai l?c ch? ho?c mang thai trong vi tr?ng -cc v?n ?? v? h? mi?n d?ch -?ang dng d?ng c? ??t t? cung (IUD) -b??u ho?c b?nh gan -cc v?n ?? v? c?c mu ?ng ho?c dng thu?c lm long mu -co gi?t (kinh phong) -dng cc thu?c qua ???ng t?nh m?ch -t? cung hnh d?ng b?t th??ng -ra huy?t m ??o khng r nguyn nhn -ph?n ?ng b?t th??ng ho?c d? ?ng v?i levonorgestrel, cc n?i ti?t t? khc, silicone, ho?c polyethylene -pha?n ??ng b?t th???ng ho??c di? ??ng v??i ca?c d??c ph?m kha?c, th?c ph?m, thu?c nhu?m, ho??c ch?t ba?o qua?n -?ang c thai ho??c ??nh co? thai -?ang cho con bu? Ti nn s? d?ng thu?c ny nh? th? no? D?ng c? ny ???c ??t vo bn trong t? cung b?i m?t chuyn vin y t?. Hy bn v?i bc s? nhi khoa c?a qu v? v? vi?c dng thu?c ny ? tr? em. C th? c?n ch?m [place] ??c bi?t. Qu li?u: N?u qu v? cho r?ng mnh ? dng qu nhi?u thu?c ny, th hy lin l?c v?i trung tm ki?m sot ch?t ??c ho?c phng c?p c?u ngay l?p t?c. L?U :  Thu?c ny ch? dnh ring cho qu v?. Khng chia s? thu?c ny v?i nh?ng ng??i khc. N?u ti l? qun m?t li?u th sao? ?i?u ny khng p d?ng. Ty thu?c vo th??ng hi?u c?a d?ng c? m qu v? ? ??t, d?ng c? ny s? c?n ph?i ???c thay th? m?i 3 ??n 5 n?m m?t l?n, n?u qu v? mu?n ti?p t?c s? d?ng bi?n php ng?a [place] ny. Nh?ng g c th? t??ng tc v?i thu?c ny? Khng ???c dng thu?c ny cng v?i b?t k? th? no sau ?y: -amprenavir -bosentan -fosamprenavir Thu?c ny c?ng c th? t??ng tc v?i cc thu?c sau ?y: -aprepitant -armodafinil -cc thu?c nhm barbiturate dng ?? gy ng? ho?c ?? ?i?u tr? cc ch?ng co gi?t -bexarotene -boceprevir -griseofulvin -m?t s? thu?c dng cho cc ch?ng co gi?t, ch?ng h?n nh? carbamazepine, ethotoin, felbamate, oxcarbazepine, phenytoin, topiramate -modafinil -pioglitazone -rifabutin -rifampicin -rifapentine -m?t s? thu?c dng ?? ?i?u tr? nhi?m HIV, nh? l atazanavir, efavirenz, indinavir, lopinavir, nelfinavir, tipranavir, ritonavir -cy [facility] (c? [facility]/cy n?c s?i/cy l?nh) -warfarin [facility] ny c th? khng m t? ?? h?t cc t??ng tc c  th? x?y ra. Hy ??a cho ng??i cung c?p d?ch v? y t? c?a mnh danh sch t?t c? cc thu?c, th?o d??c, cc thu?c khng c?n toa, ho?c cc ch? ph?m b? sung m qu v? dng. C?ng nn bo cho h? bi?t r?ng qu v? c ht thu?c, u?ng r??u, ho?c c s? d?ng ma ty tri php hay khng. Vi th? c th? t??ng tc v?i thu?c c?a qu v?. Ti c?n ph?i theo di ?i?u g trong khi dng thu?c ny? Hy ??n g?p bc s? ho?c [place] vin y t? ?? theo di ??nh k? s?c kh?e c?a mnh. Hy ?i bc s?, n?u qu v? ho?c b?n tnh c?a qu v? c sinh ho?t tnh d?c v?i nh?ng ng??i khc, b? d??ng tnh v?i HIV, ho?c b? m?c b?nh ly qua ???ng tnh d?c. Thu?c ny khng b?o v? cho qu v? kh?i b? nhi?m HIV ho?c AIDS ho?c cc b?nh ly qua ???ng tnh d?c khc. Qu v? c th? t? mnh ki?m tra v? tr c?a d?ng c? ??t trong t? ung (IUD) b?ng cch dng ngn tay lu?n ln  t?i ??nh c?a m ??o ?? s? th?y s?i ch?. Khng ???c ko s?i ch?. Ki?m tra v? tr c?a d?ng c? sau m?i k? kinh nguy?t l m?t thi quen t?t. Hy lin l?c v?i bc s? ho?c chuyn vin y t? ngay l?p t?c, n?u qu v? s? th?y ph?n thn c?a d?ng c? ??t trong t? cung (IUD) ch? khng ch? ring s?i ch?, ho?c n?u qu v? hon ton khng th? s? th?y s?i ch?. D?ng c? ??t trong t? cung (IUD) c th? t? r?t ra. Qu v? c th? th? thai, n?u d?ng c? r?t ra. N?u qu v? ??  th?y d?ng c? ??t trong t? cung (IUD - Intrauterine device) ? st ra, th hy dng m?t bi?n php ng?a [place] d? phng, ch?ng h?n nh? bao cao su v lin l?c v?i bc s? ho?c [place] vin y t? c?a mnh. Dng b?ng v? sinh d?ng th?i (tampon) s? khng lm thay ??i v? tr c?a d?ng c? ??t trong t? cung (IUD) v b?ng v? sinh d?ng th?i (tampon) c th? dng ???c trong khi c kinh nguy?t. D?ng c? ??t trong t? cung (IUD - intrauterine device) ny ch? c th? an ton khi ch?p c?ng h??ng t? (MRI) d??i nh?ng ?i?u ki?n c? th?. Tr??c khi ch?p MRI, hay bo cho bc s? ho?c chuyn vin y t? bi?t r?ng qu v? ?ang dng d?ng c? ??t trong t? cung (IUD), v lo?i d?ng c? ??t trong t? cung (IUD) m qu v? ?ang dng. Ti c th? nh?n th?y nh?ng tc d?ng ph? no khi dng thu?c ny? Nh?ng tc d?ng ph? qu v? c?n ph?i bo cho bc s? ho?c chuyn vin y t? cng s?m cng t?t: -cc ph?n ?ng d? ?ng, ch?ng h?n nh? da b? m?n ??, ng?a, n?i my ?ay, s?ng ? m?t, mi, ho?c l??i -s?t ho?c cc tri?u ch?ng gi?ng cm -lot c? quan sinh d?c -huy?t a?p cao -khng c kinh nguy?t trong 6 tu?n trong khi s? d?ng -?au, s?ng ho?c c?m th?y nng ? chn -?au ho?c  ?m vng khung ch?u -?au ??u d? d?i ho?c ??t ng?t -cc d?u hi?u c thai -?au qu?n b?ng -kh th? ??t ng?t -cc v?n ?? v? gi? th?ng b?ng, ni n?ng, ?i l?i -d?ch ti?t, xu?t huy?t m ??o b?t th??ng -vng da ho?c m?t Cc tc d?ng ph? khng c?n ph?i ch?m [place] y t? (hy bo cho  bc s? ho?c chuyn vin y t?, n?u cc tc d?ng ph? ny ti?p di?n ho?c gy phi?n  toi): -m?n tr?ng c -?au v -cc thay ??i v? ham mu?n tnh d?c ho?c kh? n?ng tnh d?c -t?ng cn ho?c s?t cn -?au qu?n, chng m?t ho?c b? ng?t x?u khi d?ng c? ?ang ???c ??t vo -?au ??u -hnh kinh khng ??u n?i trong 3 ??n 6 thng ??u s? d?ng -bu?n i [facility] ny c th? khng m t? ?? h?t cc tc d?ng ph? c th? x?y ra. Xin g?i t?i bc s? c?a mnh ?? ???c c? v?n chuyn mn v? cc tc d?ng ph?Sander Nephew v? c th? t??ng trnh cc tc d?ng ph? cho FDA theo s? [phone]. Ti nn c?t gi? thu?c c?a mnh ? ?u? ?i?u ny khng p d?ng. L?U : ?y l b?n tm t?t. N c th? khng bao hm t?t c? thng tin c th? c. N?u qu v? th?c m?c v? thu?c ny, xin trao ??i v?i bc s?, d??c s?, ho?c ng??i cung c?p d?ch v? y t? c?a mnh.  2018 [employer]/Gold Standard (2016-03-22 00:00:00)

## 2018-03-13 IMAGING — US US OB COMP LESS 14 WK
1 series · 14 of 28 positions shown · non-contrast
Comparison: None.

CLINICAL DATA: Vaginal bleeding, beta HCG 1,060

EXAM:
OBSTETRIC <14 WK US AND TRANSVAGINAL OB US
TECHNIQUE: Both transabdominal and transvaginal ultrasound examinations were
performed for complete evaluation of the gestation as well as the
maternal uterus, adnexal regions, and pelvic cul-de-sac.
Transvaginal technique was performed to assess early pregnancy.

[Series 1: us ob comp less 14 wk · 0.18mm/px · 14 of 46 slices shown]
[im 2/46]
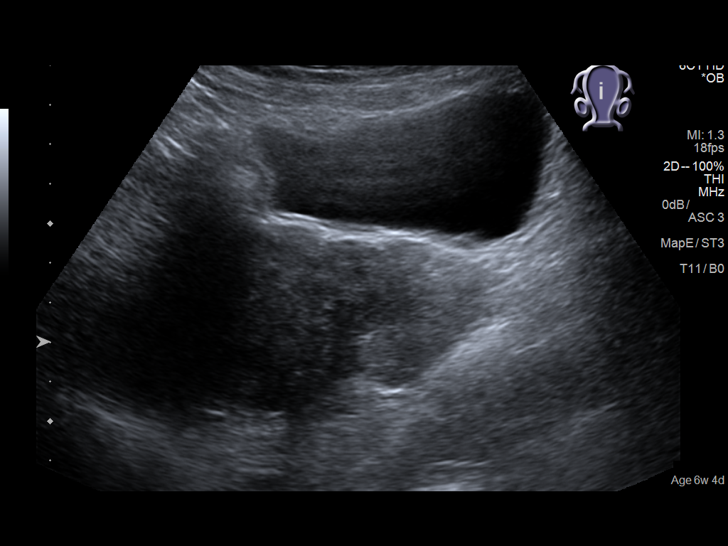
[im 6/46]
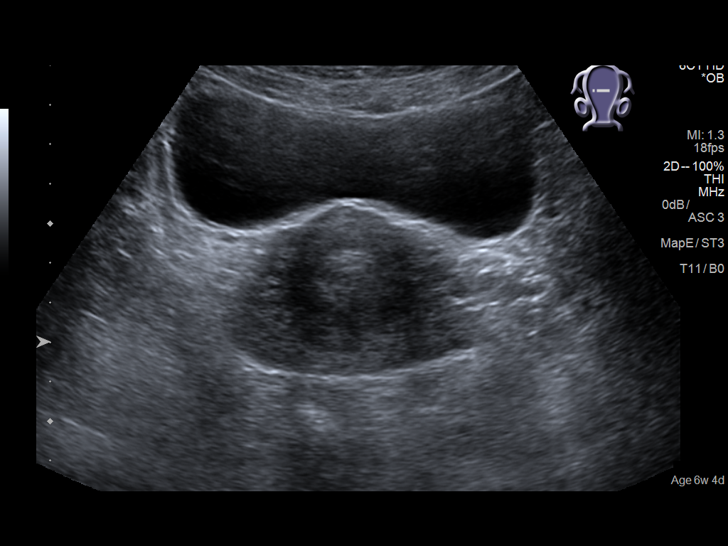
[im 9/46]
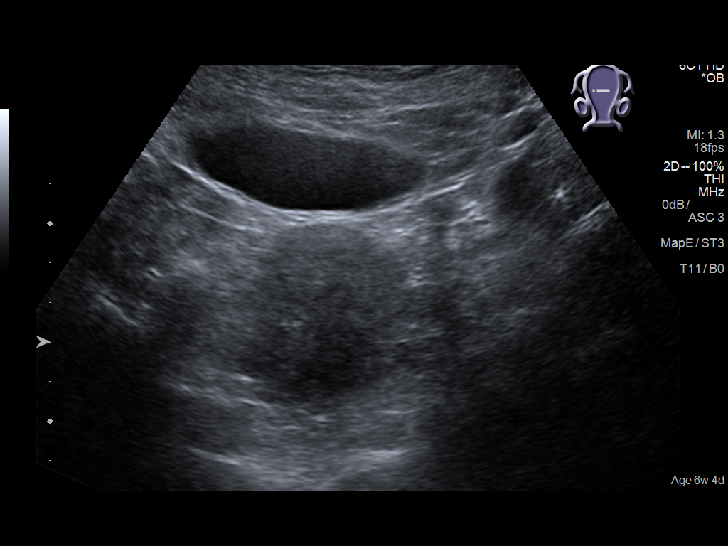
[im 12/46]
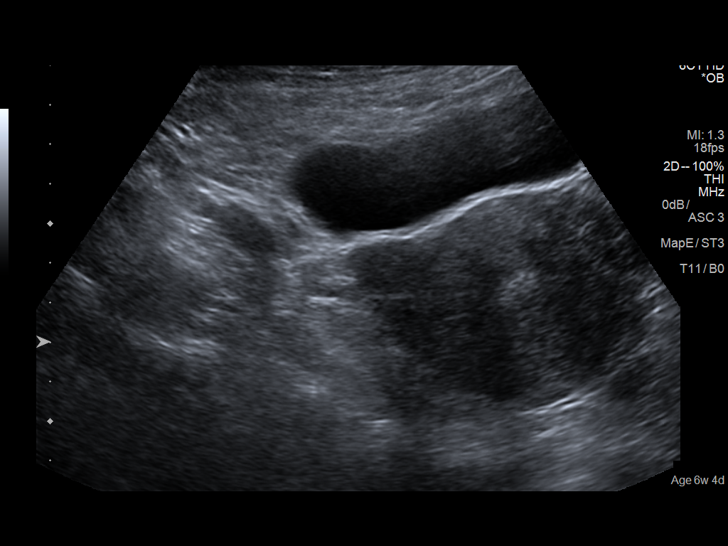
[im 16/46]
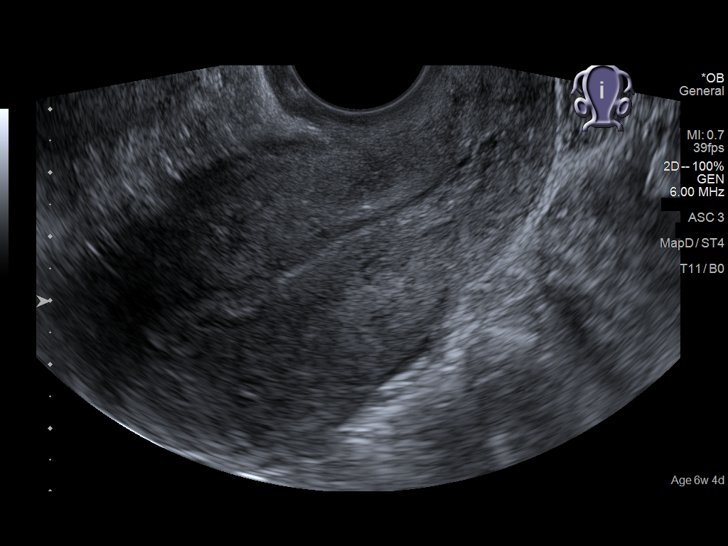
[im 19/46]
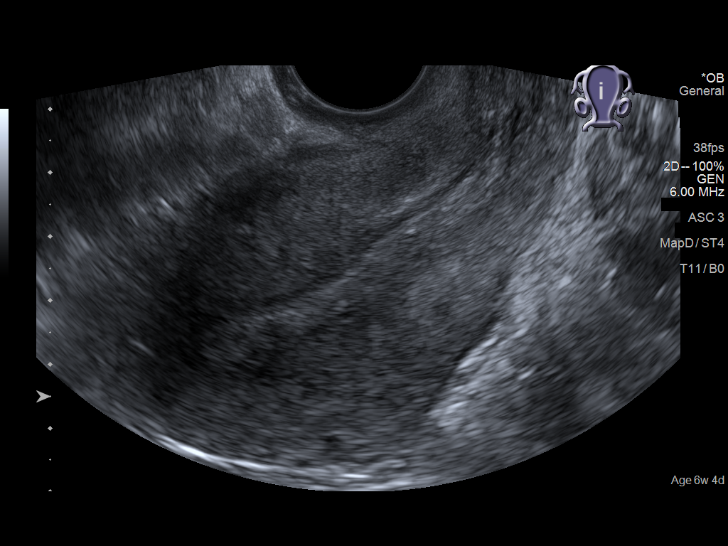
[im 22/46]
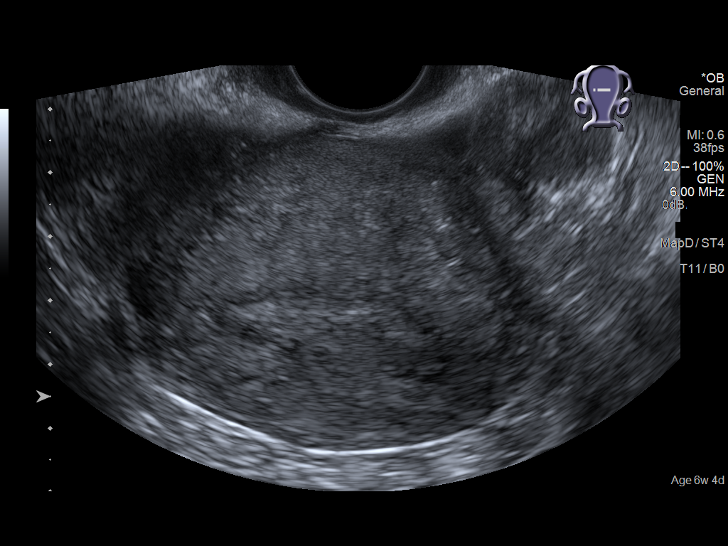
[im 26/46]
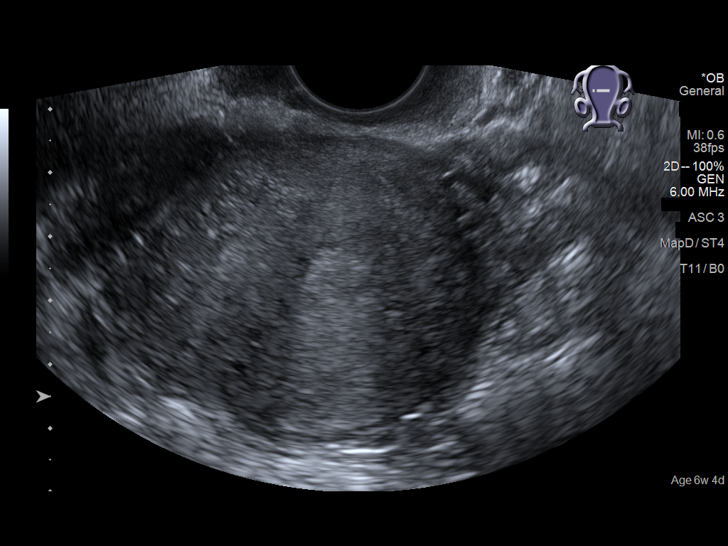
[im 29/46]
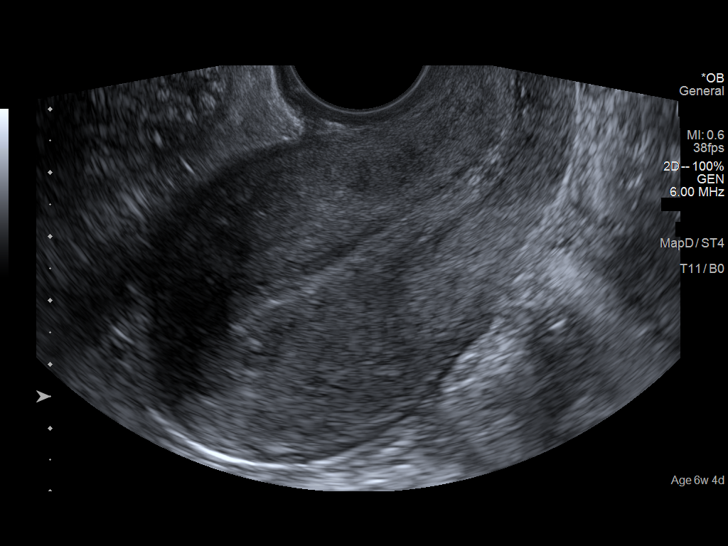
[im 32/46]
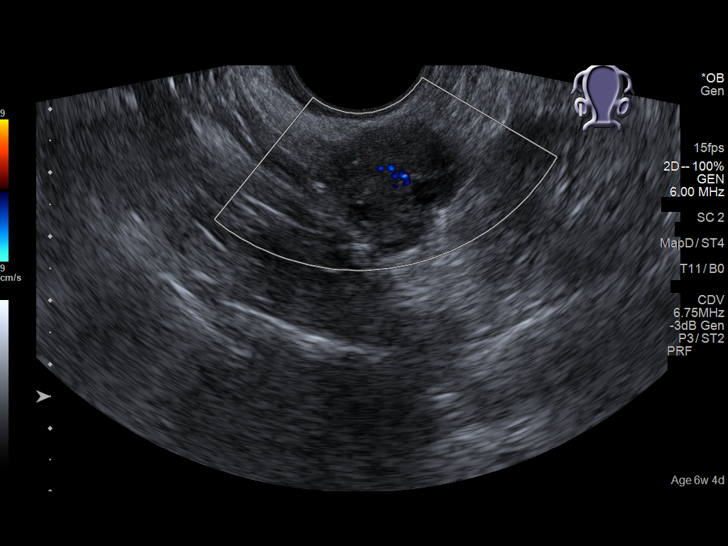
[im 36/46]
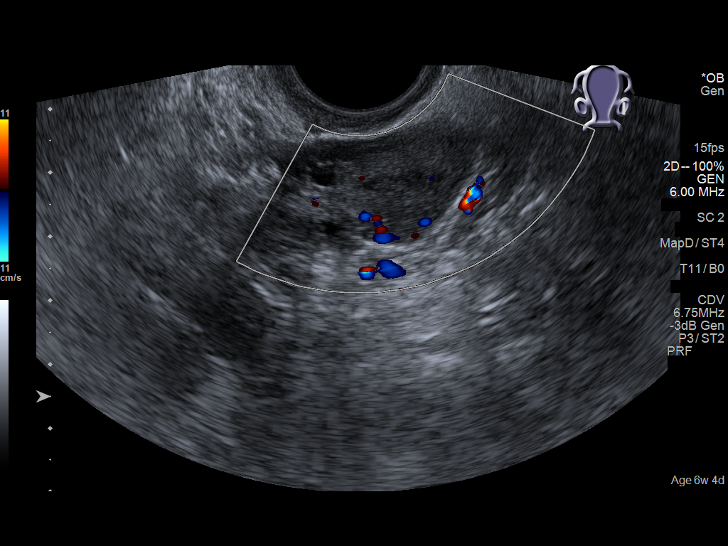
[im 39/46]
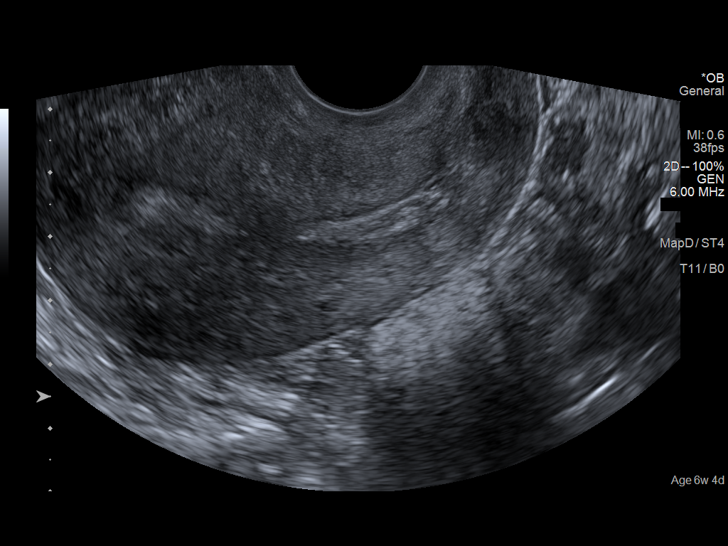
[im 42/46]
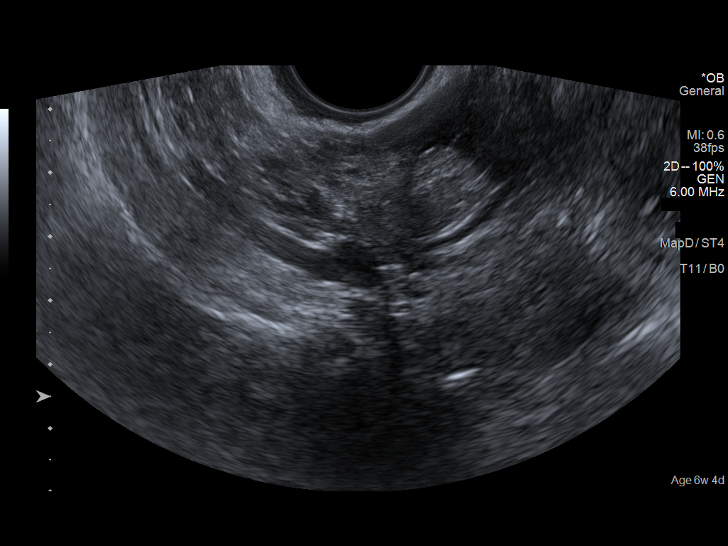
[im 46/46]
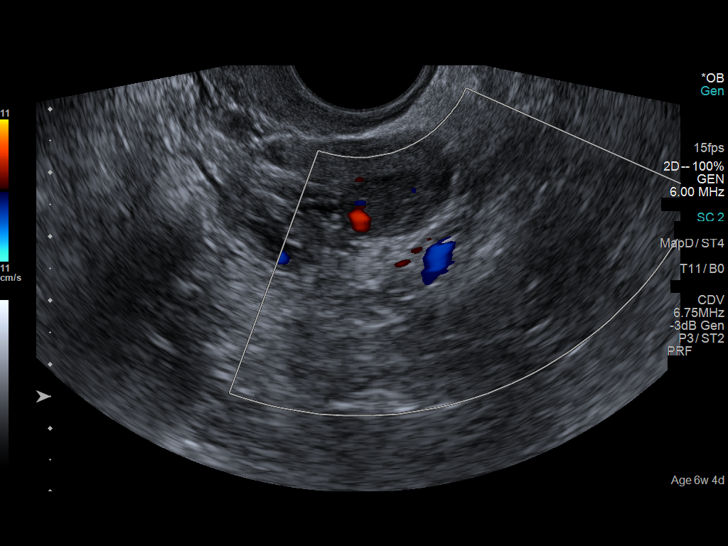

[14 of 28 positions shown; findings below may reference images not displayed]

FINDINGS: Intrauterine gestational sac: None

Yolk sac:  Not Visualized.

Embryo:  Not Visualized.

Cardiac Activity: Not applicable.

Heart Rate: Not applicable.

Subchorionic hemorrhage:  None visualized.

Maternal uterus/adnexae: No ectopic pregnancy is identified. The
right ovary measured 1.7 x 2.2 x 2.6 cm and the left ovary measures
3.4 x 1.8 x 1.8 cm. The uterus is slightly anteverted in appearance.
The endometrial stripe is homogeneous. There is no free fluid in the
pelvis.
IMPRESSION: No intrauterine or ectopic pregnancy is noted. Given history of
vaginal bleeding, a spontaneous abortion might account for this
appearance.

## 2019-05-06 IMAGING — US US FETAL BPP W/ NON-STRESS
1 series · 14 of 14 positions shown · non-contrast
Comparison: none

[Series 1: us fetal bpp w/nonstress · 14 acquisitions, 14 frames shown]
[im 1/14]
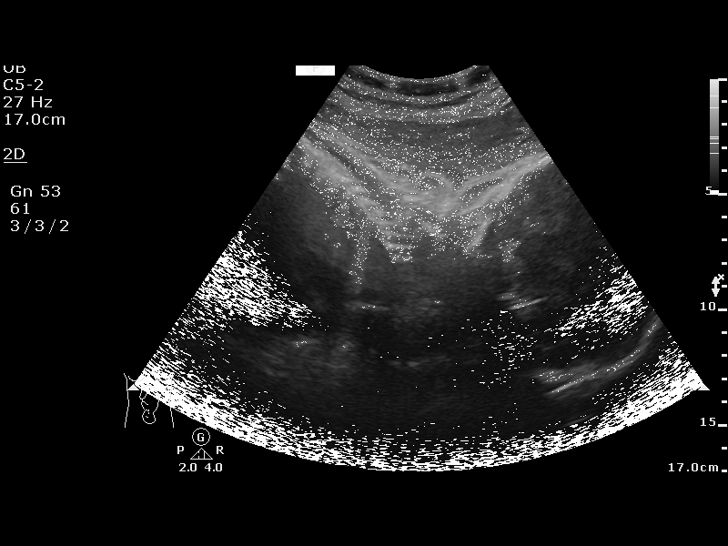
[im 2/14]
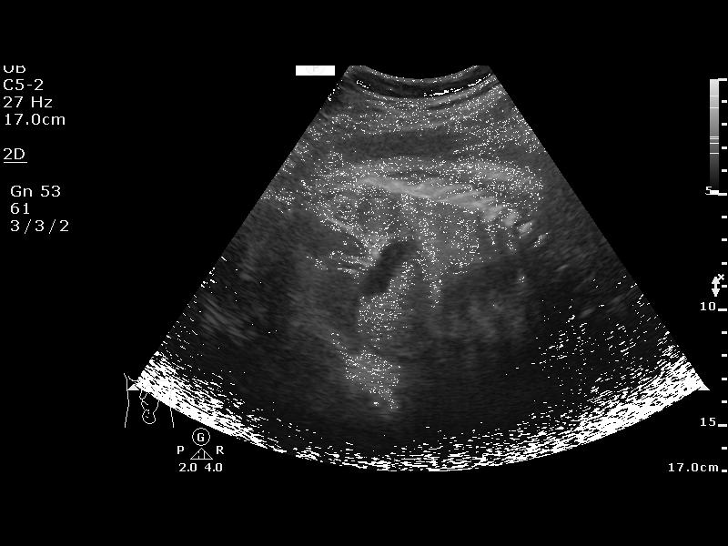
[im 3/14]
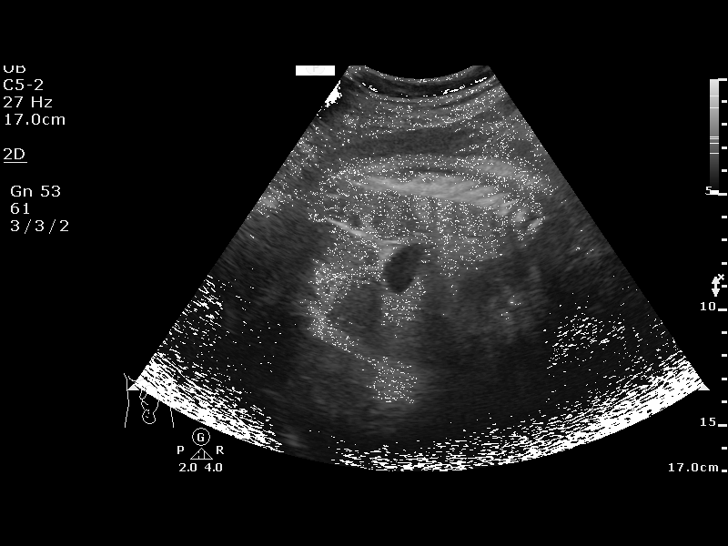
[im 4/14]
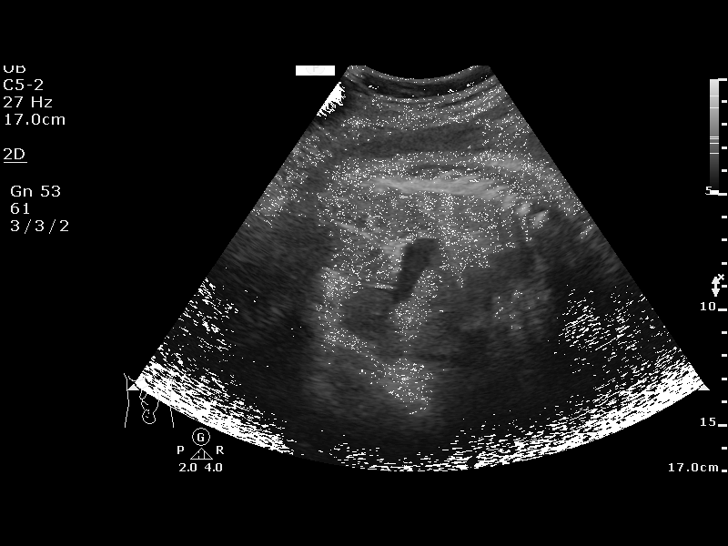
[im 5/14]
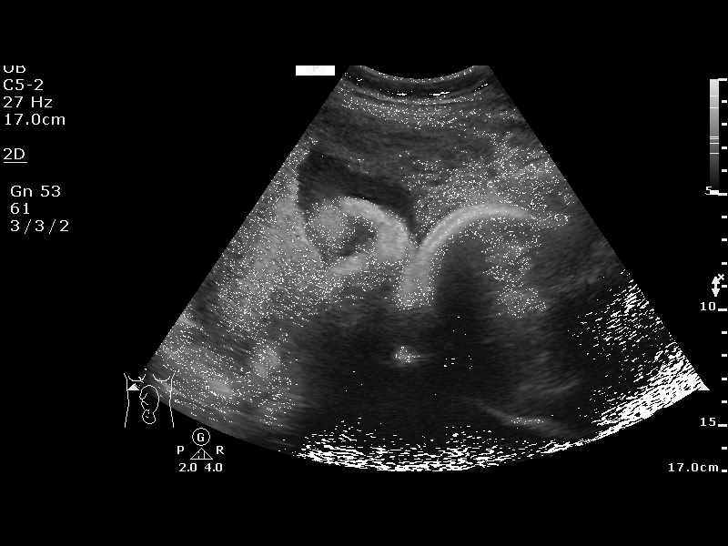
[im 6/14]
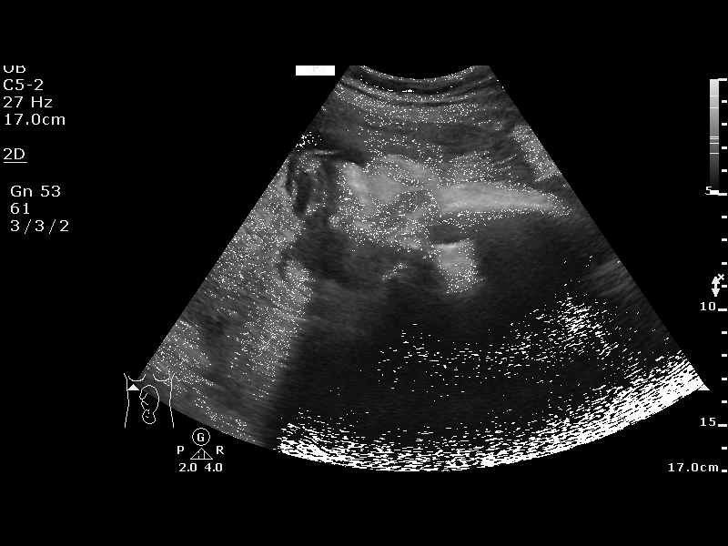
[im 7/14]
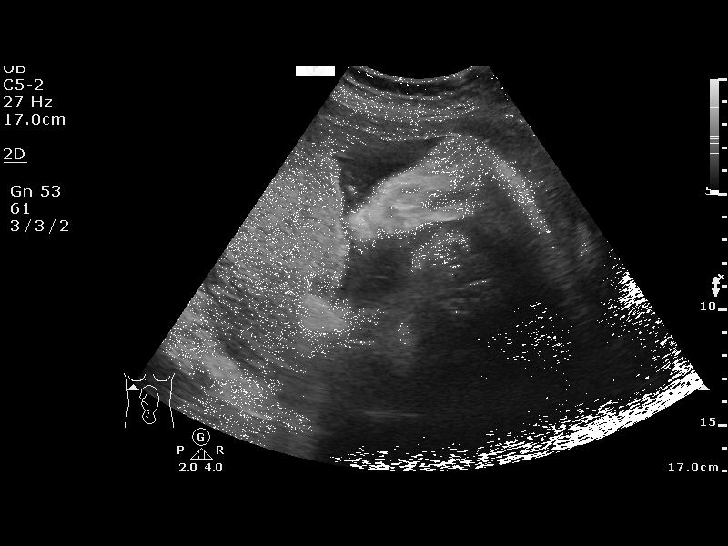
[im 8/14]
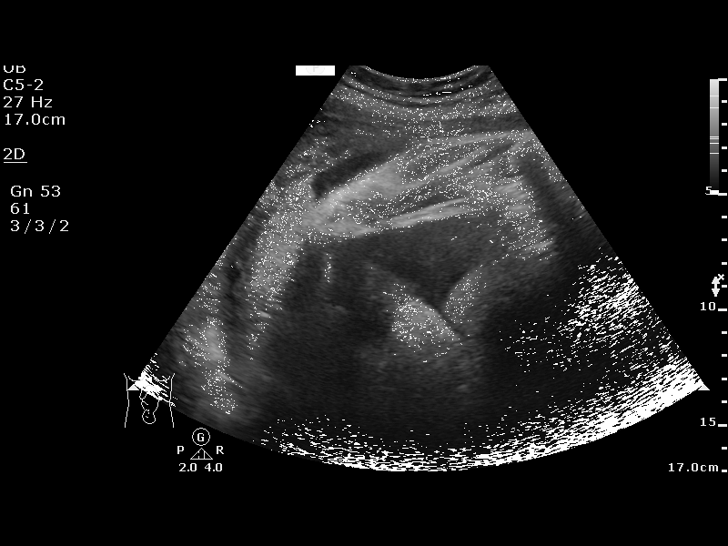
[im 9/14]
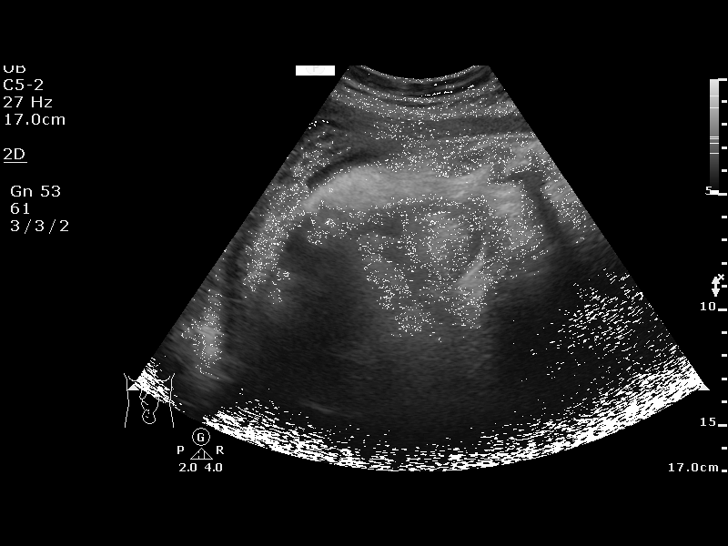
[im 10/14]
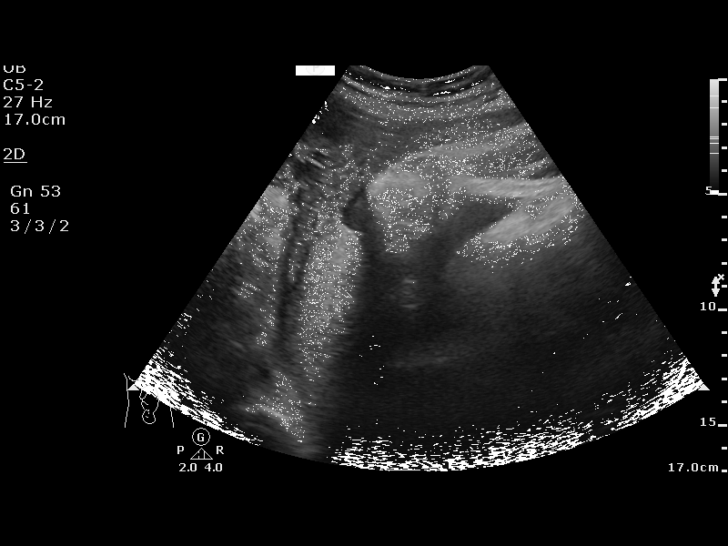
[im 11/14]
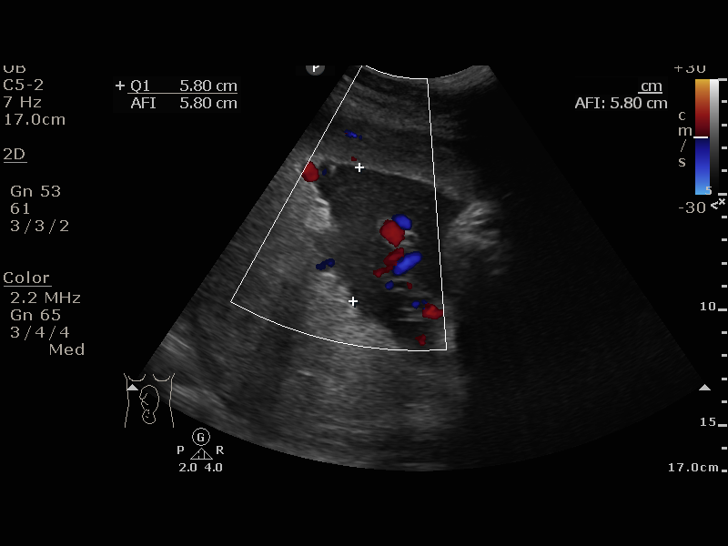
[im 12/14]
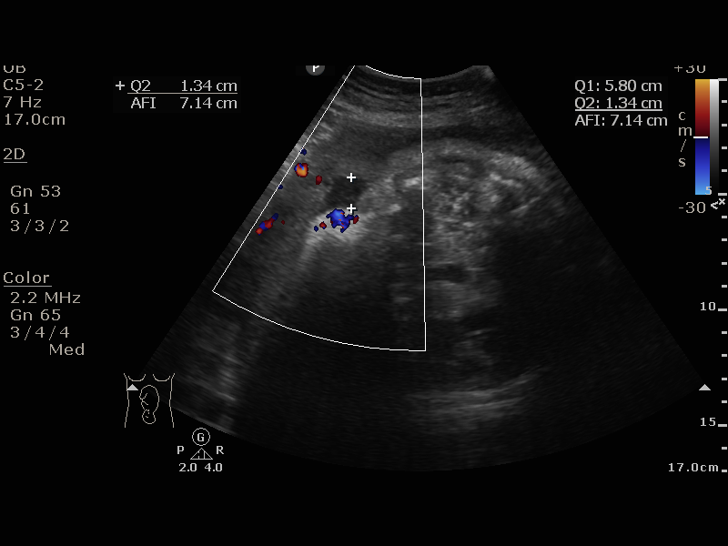
[im 13/14]
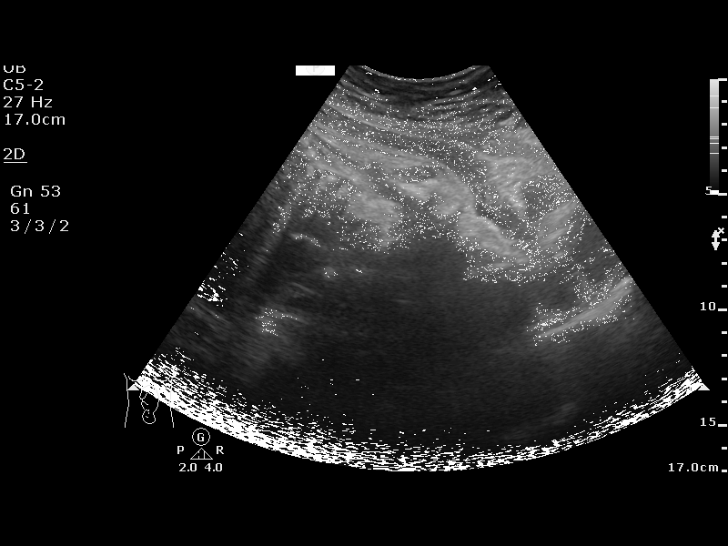
[im 14/14]
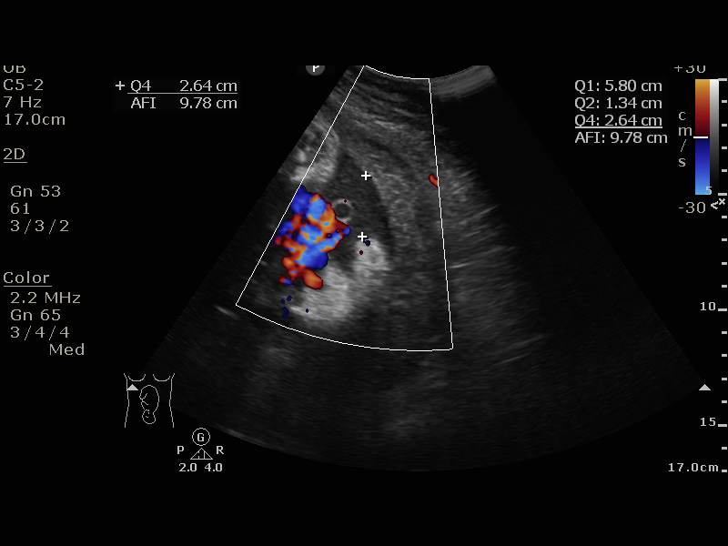

[14 of 14 positions shown; findings below may reference images not displayed]

[facility]
[REDACTED]

1  US FETAL BPP W/NONSTRESS             76818.4      HOCK HENG GUANG

Service(s) Provided

Indications

40 weeks gestation of pregnancy
Postdate pregnancy (40-42 weeks)
Fetal Evaluation

Num Of Fetuses:         1
Preg. Location:         Intrauterine
Cardiac Activity:       Observed
Presentation:           Cephalic

Amniotic Fluid
AFI FV:      Within normal limits

AFI Sum(cm)     %Tile       Largest Pocket(cm)
9.78            31

RUQ(cm)       RLQ(cm)       LUQ(cm)
5.8
Biophysical Evaluation

Amniotic F.V:   Pocket => 2 cm two         F. Tone:        Observed
planes
F. Movement:    Observed                   N.S.T:          Reactive
F. Breathing:   Observed                   Score:          [DATE]
OB History

Gravidity:    4         Term:   2        Prem:   0        SAB:   1
TOP:          0       Ectopic:  0        Living: 2
Gestational Age

LMP:           40w 5d        Date:  02/24/17                 EDD:   12/01/17
Best:          40w 5d     Det. By:  LMP  (02/24/17)          EDD:   12/01/17
Impression

Reassuring antenatal testing
Recommendations

Repeat as needed
